# Patient Record
Sex: Male | Born: 1960 | Race: White | Hispanic: No | Marital: Married | Smoking: Never smoker
Health system: Southern US, Community
[De-identification: ages and names within clinical notes are randomized; demographics above are authoritative.]

## PROBLEM LIST (undated history)

## (undated) DIAGNOSIS — I10 Essential (primary) hypertension: Secondary | ICD-10-CM

## (undated) DIAGNOSIS — I2510 Atherosclerotic heart disease of native coronary artery without angina pectoris: Secondary | ICD-10-CM

## (undated) HISTORY — PX: CORONARY STENT PLACEMENT: SHX1402

---

## 2004-04-13 ENCOUNTER — Emergency Department (HOSPITAL_COMMUNITY): Admission: EM | Admit: 2004-04-13 | Discharge: 2004-04-13 | Payer: Self-pay | Admitting: *Deleted

## 2006-06-20 ENCOUNTER — Inpatient Hospital Stay (HOSPITAL_COMMUNITY): Admission: EM | Admit: 2006-06-20 | Discharge: 2006-06-21 | Payer: Self-pay | Admitting: Emergency Medicine

## 2006-06-20 IMAGING — CR DG CHEST 1V PORT
1 series · 1 of 1 positions shown · non-contrast
Comparison: None.

CLINICAL DATA: Chest and left arm pain

[view not recorded]
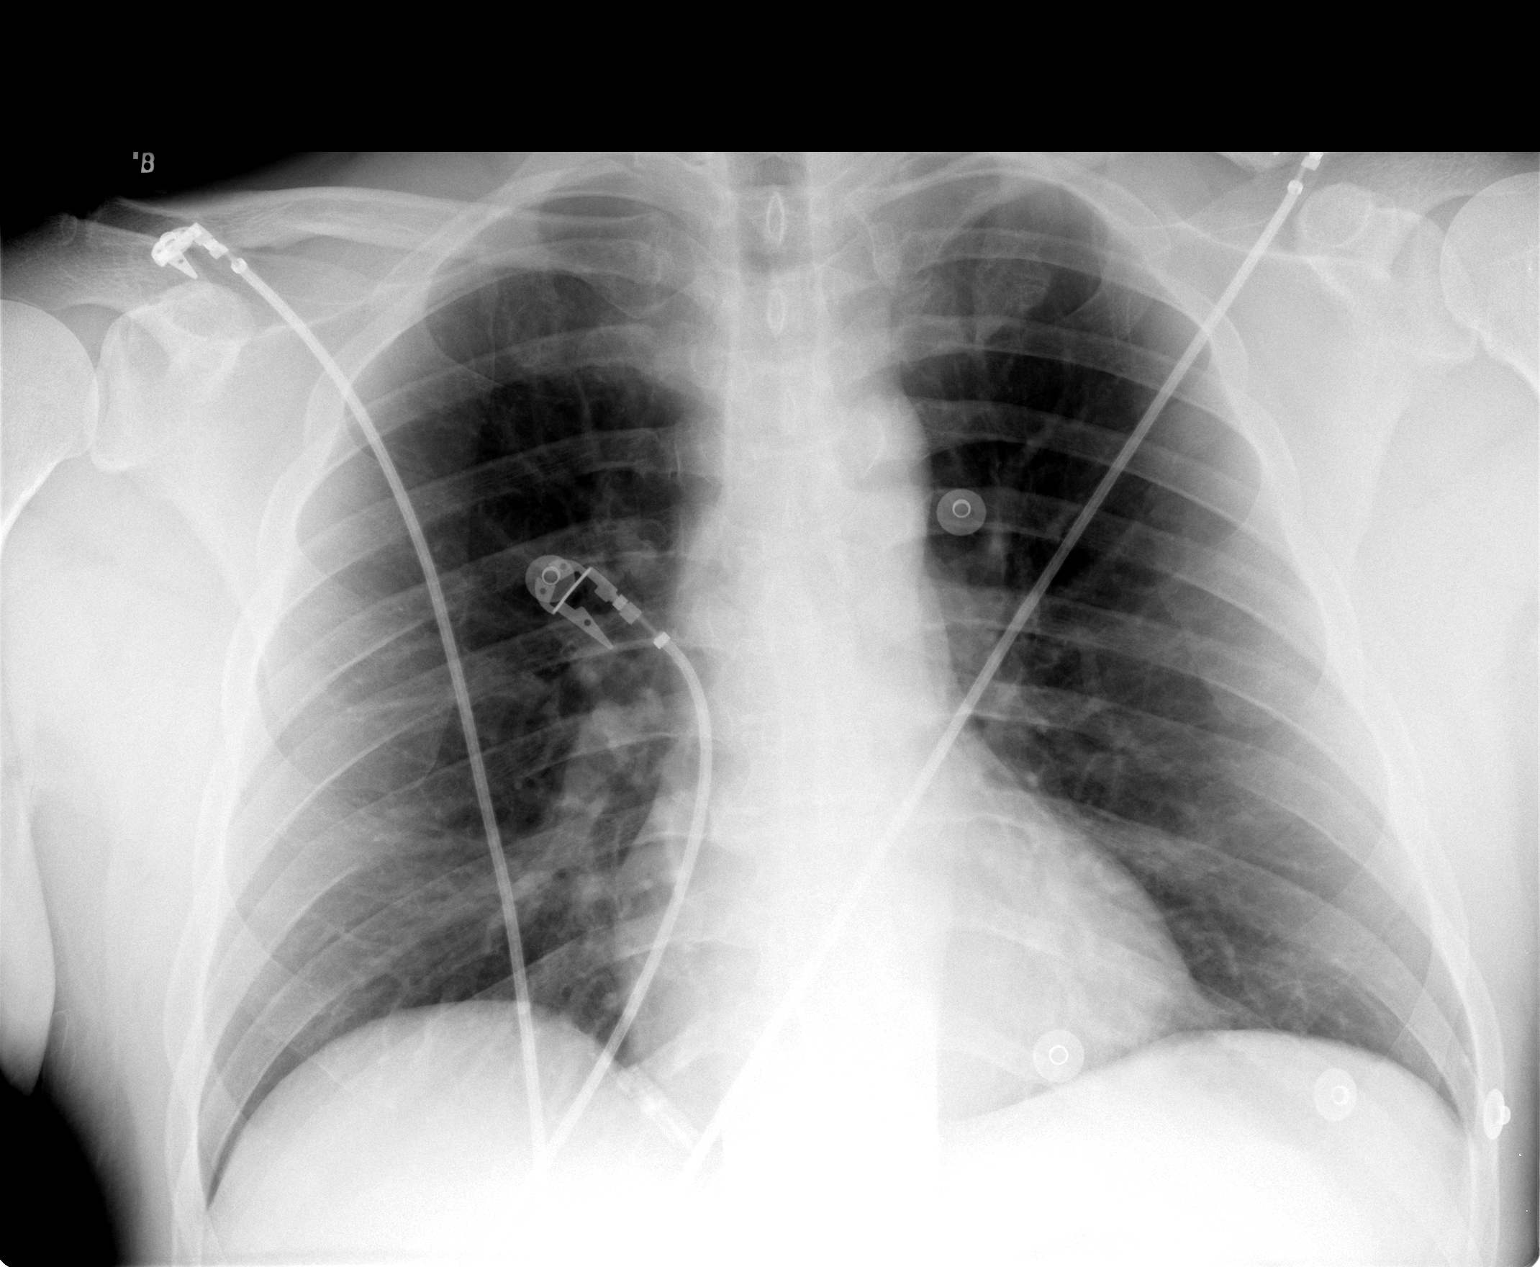

[1 of 1 positions shown; findings below may reference images not displayed]

PORTABLE CHEST - 1 VIEW:

0890 hours. The lungs are clear. Heart size is normal. Imaged bony structures of
the thorax are intact. Telemetry leads overlie the chest.
IMPRESSION: No acute cardiopulmonary findings.

## 2007-03-12 ENCOUNTER — Inpatient Hospital Stay (HOSPITAL_COMMUNITY): Admission: EM | Admit: 2007-03-12 | Discharge: 2007-03-15 | Payer: Self-pay | Admitting: Emergency Medicine

## 2007-03-12 IMAGING — CR DG CHEST 1V PORT
1 series · 1 of 1 positions shown · non-contrast
Comparison: 06/20/06
 The heart size and mediastinal contours are within normal limits.  Both lungs are clear.  The visualized skeletal structures are unremarkable.

CLINICAL DATA: Chest pain.  
 PORTABLE CHEST- 1 VIEW (1612 hours):

[view not recorded]
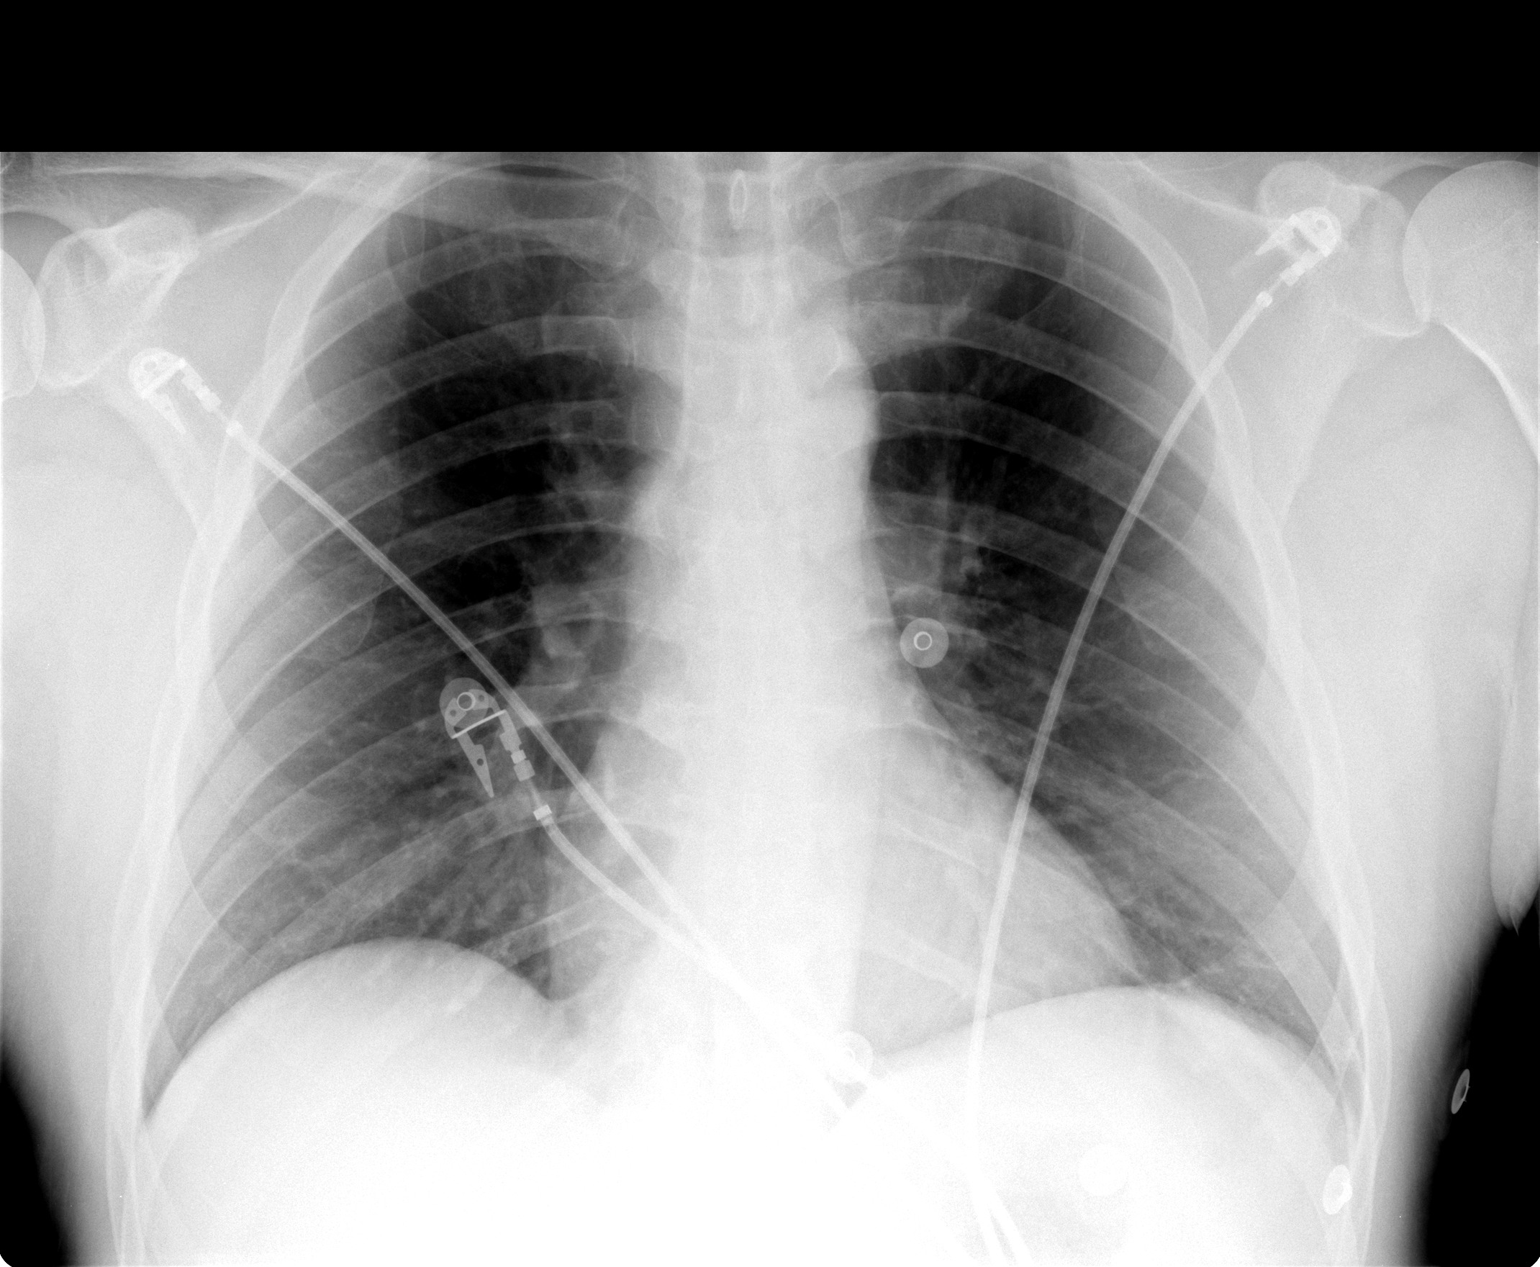

[1 of 1 positions shown; findings below may reference images not displayed]

IMPRESSION: No active cardiopulmonary disease.

## 2007-03-27 ENCOUNTER — Encounter (HOSPITAL_COMMUNITY): Admission: RE | Admit: 2007-03-27 | Discharge: 2007-05-06 | Payer: Self-pay | Admitting: Cardiology

## 2008-02-11 ENCOUNTER — Ambulatory Visit (HOSPITAL_COMMUNITY): Admission: RE | Admit: 2008-02-11 | Discharge: 2008-02-11 | Payer: Self-pay | Admitting: Cardiology

## 2010-12-19 ENCOUNTER — Encounter (HOSPITAL_BASED_OUTPATIENT_CLINIC_OR_DEPARTMENT_OTHER)
Admission: RE | Admit: 2010-12-19 | Discharge: 2010-12-19 | Disposition: A | Discharge status: Home / Self Care | Payer: BC Managed Care – PPO | Source: Ambulatory Visit | Attending: Orthopedic Surgery | Admitting: Orthopedic Surgery

## 2010-12-19 ENCOUNTER — Ambulatory Visit
Admission: RE | Admit: 2010-12-19 | Discharge: 2010-12-19 | Disposition: A | Discharge status: Home / Self Care | Payer: BC Managed Care – PPO | Source: Ambulatory Visit | Attending: Orthopedic Surgery | Admitting: Orthopedic Surgery

## 2010-12-19 ENCOUNTER — Other Ambulatory Visit: Payer: Self-pay | Admitting: Orthopedic Surgery

## 2010-12-19 DIAGNOSIS — Z01811 Encounter for preprocedural respiratory examination: Secondary | ICD-10-CM

## 2010-12-19 LAB — POCT I-STAT, CHEM 8
Calcium, Ion: 1.21 mmol/L (ref 1.12–1.32)
Glucose, Bld: 115 mg/dL — ABNORMAL HIGH (ref 70–99)
HCT: 44 % (ref 39.0–52.0)
Hemoglobin: 15 g/dL (ref 13.0–17.0)
TCO2: 26 mmol/L (ref 0–100)

## 2010-12-19 IMAGING — CR DG CHEST 2V
2 series · 2 of 2 positions shown · non-contrast
Comparison: Portable chest x-ray of 03/12/2007

CLINICAL DATA: Preop for ankle surgery

CHEST - 2 VIEW

[w chest pa]
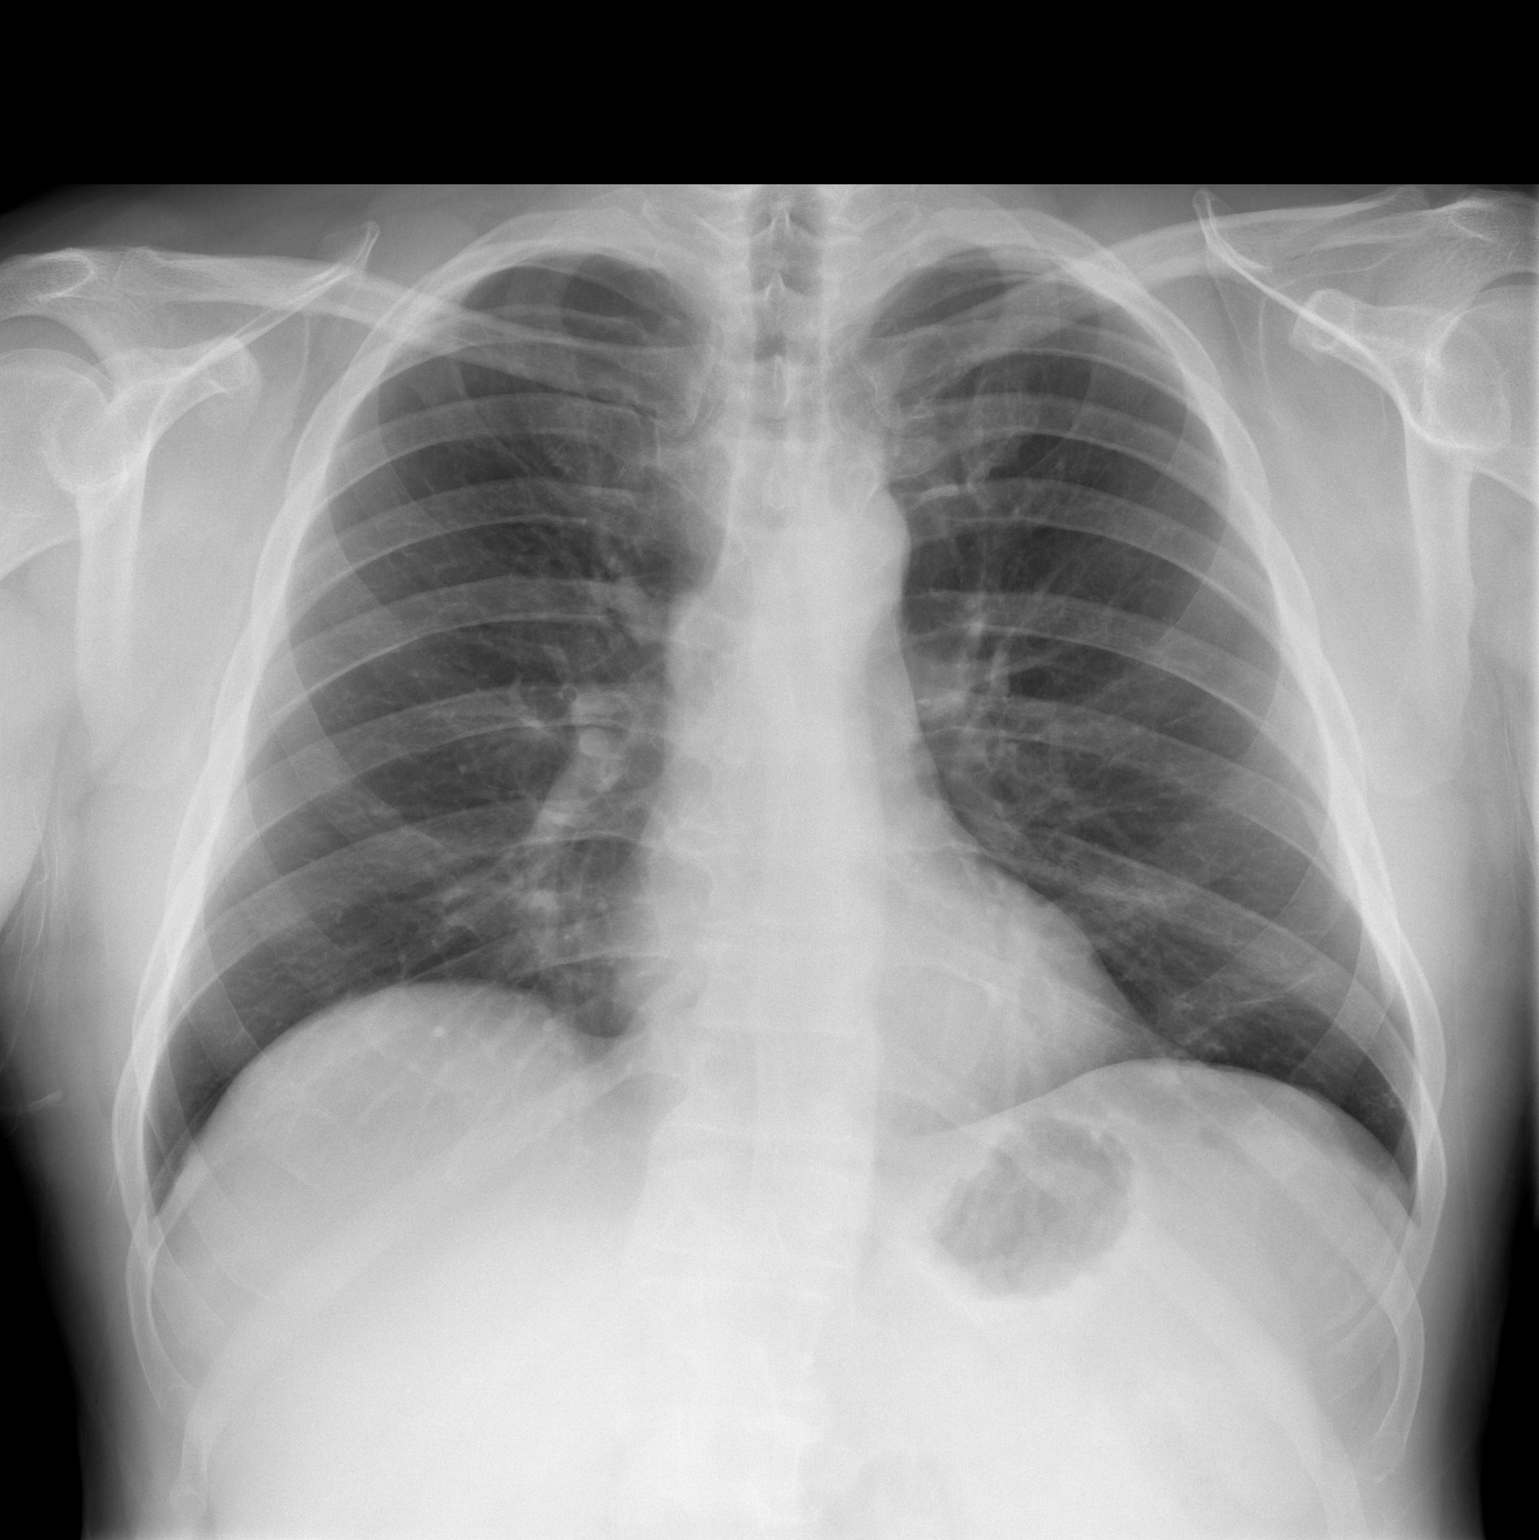

[w chest lat]
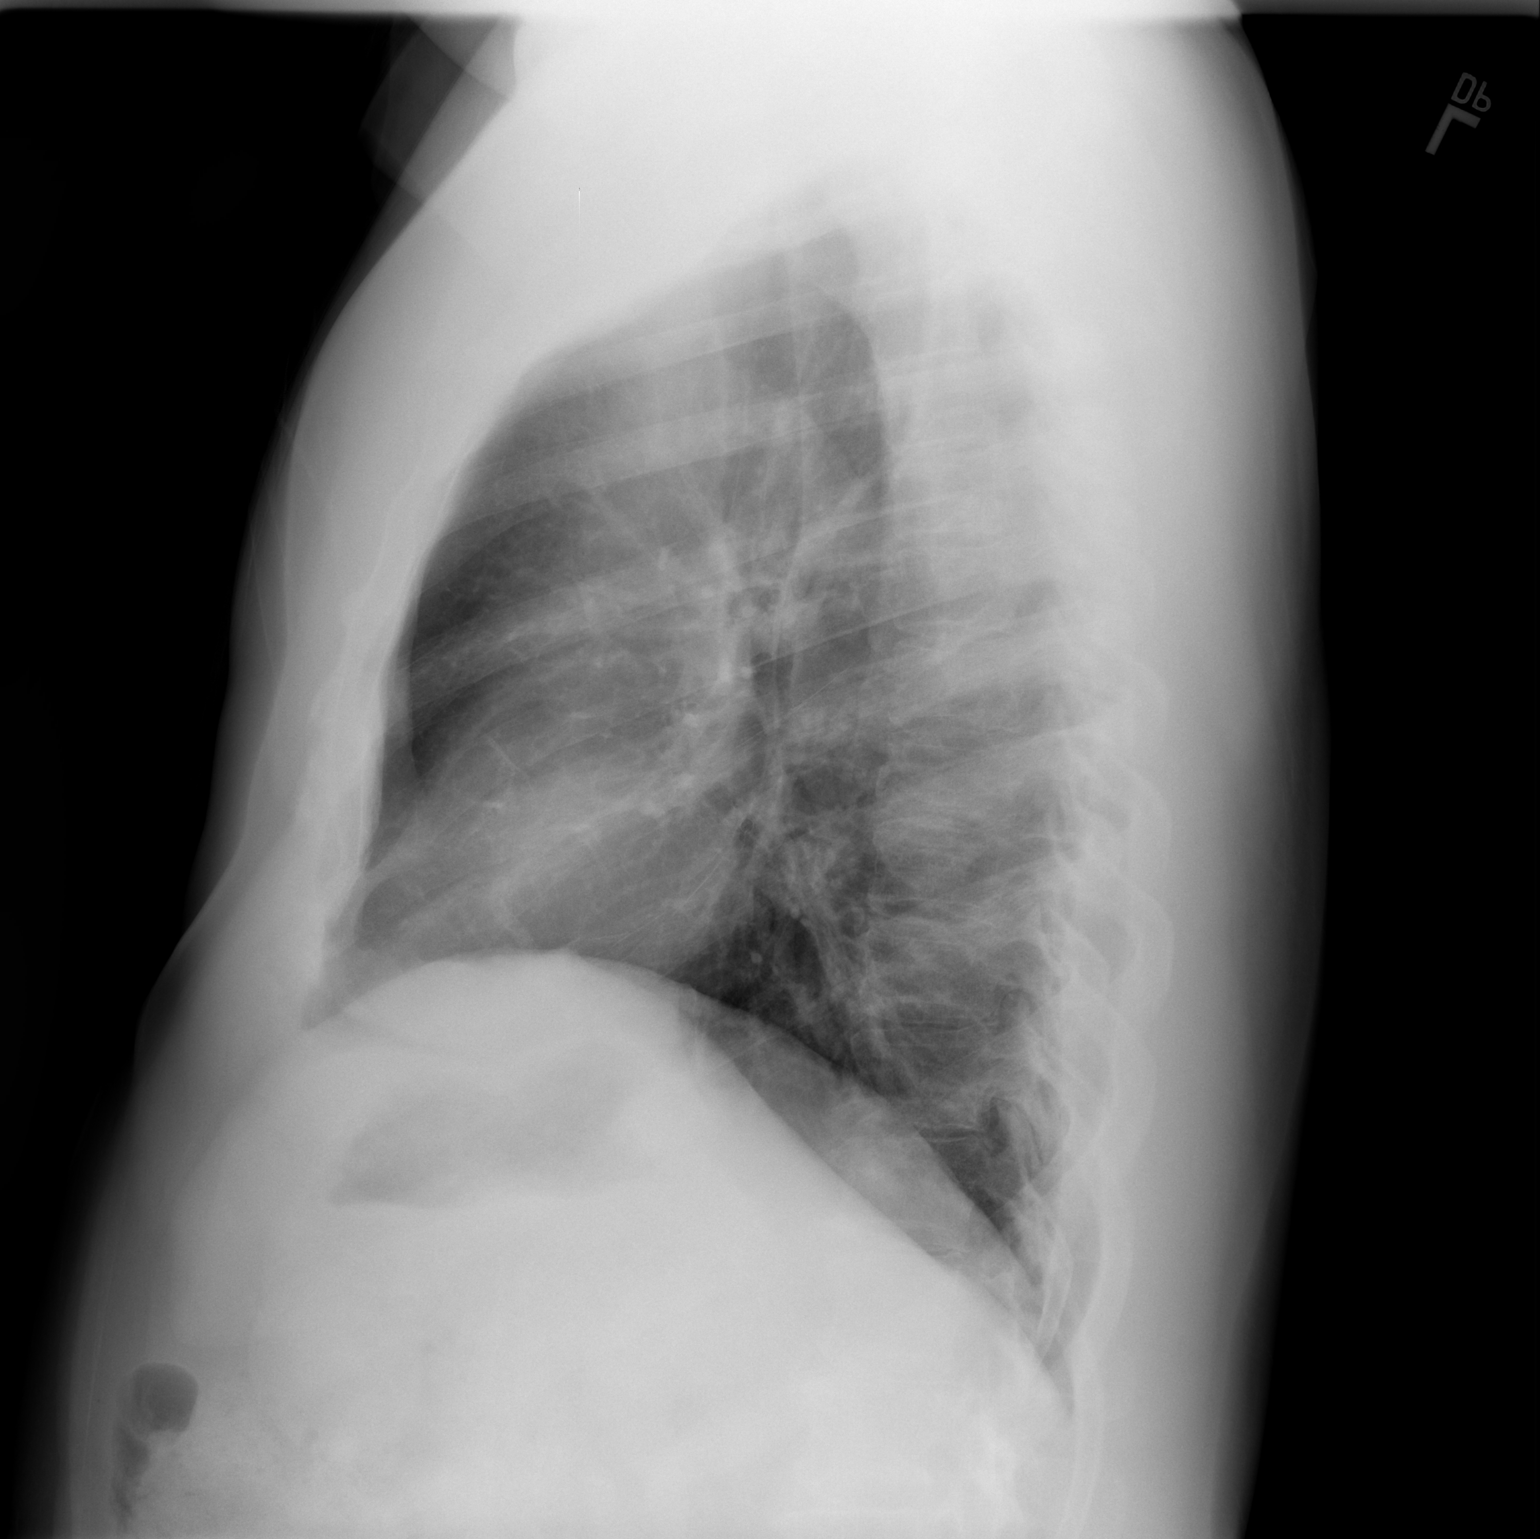

[2 of 2 positions shown; findings below may reference images not displayed]

FINDINGS: The lungs are clear.  Mediastinal contours are stable.
The heart is within normal limits in size.  No bony abnormality is
seen.
IMPRESSION: No active lung disease.

## 2010-12-19 NOTE — Cardiovascular Report (Signed)
NAME:  Mark Melendez, Mark Melendez NO.:  [id number]   MEDICAL RECORD NO.:  [id number]          PATIENT TYPE:  INP   LOCATION:  6524                         FACILITY:  [facility]   PHYSICIAN:  Mohan N. Sharyn Lull, M.D. DATE OF BIRTH:  Dec 31, 1960   DATE OF PROCEDURE:  03/14/2007  DATE OF DISCHARGE:                            CARDIAC CATHETERIZATION   PROCEDURE PERFORMED:  1. Left cardiac catheterization with selective left and right coronary      angiography, left ventriculography via the right groin using the      PIMS technique.  2. Successful percutaneous transluminal coronary angioplasty to the      proximal right coronary artery using a 2.5 x 8 mm long Voyager      balloon.  3. Successful deployment of a 3.0 x 18 mm long Cypher drug-eluting      stent in the proximal right coronary artery.  4. Successful post dilatation of the Cypher drug-eluting stent using a      3.25 x 13 mm long and then a 3.5 x 13 mm long Power Sail balloon.  5. Successful percutaneous transluminal coronary angioplasty to the      proximal left circumflex using initially 2.5 x 8 mm long Voyager      balloon, and then a 2.5 x 13 mm long Voyager balloon for pre-      dilatation.  6. Successful deployment of a 3.0 x 32 mm long Taxus drug-eluting      stent in the proximal and mid left circumflex.  7. Successful post dilatation of the Taxus drug-eluting stent using a      3.25 x 18 mm long Power Sail balloon in the mid and proximal left      circumflex.  8. Successful post dilatation of the proximal stent using a 3.5 x 13      mm long Power Sail balloon.   INDICATIONS FOR PROCEDURE:  Mark Melendez is a 50 year old white male with a  past medical history significant for hypertension, hypercholesterolemia,  hyperhomocysteinemia, glucose intolerance, positive family history of  coronary artery disease.  He came to the ER complaining of retrosternal  burning chest pain off-and-on since Monday, lasting a few  minutes,  associated with diaphoresis.  Grade initially 3/10, and then grade 8/10;  so he decided to come to the ER.  He denies any nausea or vomiting,  denies palpitations, lightheadedness, syncope, denies shortness of  breath, denies PND, orthopnea or leg swelling.  Denies exertional chest  pain.  Denies relation of chest pain to food, breathing or movement.   PAST MEDICAL HISTORY:  As above.   PAST SURGICAL HISTORY:  1. Right knee and ankle surgery in the past.  2. Had bilateral carpal tunnel surgery in 2006.   ALLERGIES:  No known drug allergies.   MEDICATIONS:  1. At home, was on enteric coated aspirin 81 mg p.o. daily.  2. Toprol 25 mg p.o. daily.  3. Lipitor 20 mg p.o. daily which he stopped.  4. Elita Quick one p.o. daily.  5. Nitrostat sublingual as needed.   SOCIAL HISTORY:  He is married  and has two children.  No history of  smoking.  Drinks beer and wine socially.  He works as an Interior and spatial designer.   FAMILY HISTORY:  Father died of a massive MI at the age of 72.  Mother  is alive, she has lymphoma.  One brother had a lacunar infarction in his  25s.  One uncle died of an MI in his 23s.  Two aunts had MI in their  72s.   PHYSICAL EXAMINATION:  GENERAL APPEARANCE:  On examination, he was  alert, awake and oriented x3, in no acute distress.  VITAL SIGNS:  Blood pressure was 157/92, pulse was 71.  EYES:  Conjunctivae are pink.  NECK:  Supple.  No JVD, no bruit.  LUNGS:  Lungs were clear to auscultation without rhonchi or rales.  CARDIOVASCULAR:  S1 and S2 were normal.  There was a soft systolic  murmur.  ABDOMEN:  The abdomen was soft.  Bowel sounds were present, nontender.  EXTREMITIES:  There was no clubbing, cyanosis or edema.   LABORATORY INVESTIGATIONS:  His two sets of cardiac enzymes, CPK, MB and  troponin I were negative.  Glucose was 118, his cholesterol was 225, LDL  was 143, HDL 29.  Hemoglobin A1c was 5.8, creatinine was 1.0.  Hemoglobin was 15.9,  hematocrit 42.3.   INTERVAL HOSPITAL COURSE:  The patient was admitted to the telemetry  unit and was ruled out by serial enzymes and EKG.  The patient  subsequently underwent a stress Myoview today, exercised for 9.52  minutes on the Bruce protocol with complaints of retrosternal chest  tightness associated with shortness of breath and dizziness, with drop  in blood pressure at the peak of exercise.  Peak heart rate was 138,  peak blood pressure was 172/86.  EKG showed normal sinus rhythm with  nonspecific T wave changes at baseline, with upsloping ST depression in  the inferolateral leads which reverted towards baseline.  Recurrent PVCs  in the apices were noted during the test.  The Myoview scan showed an  area of reversible ischemia centered in the lateral wall with extension  in the anterolateral and inferior wall at the base with an EF of 54%.  Due to the typical exertional chest pain and positive stress test and  multiple risk factors, discussed with the patient at length regarding  left catheterization and possible PTCA stenting, its risks and benefits,  i.e., death and mild stroke, need for emergency CABG, risks of  restenosis, local vascular complications, etc.; and he consented for the  procedure.   DESCRIPTION OF PROCEDURE:  After obtaining informed consent, the patient  was brought to the catheterization lab and was placed on the fluoroscopy  table.  The right groin was prepped and draped in the usual fashion.  Then 2% Xylocaine was used for local anesthesia in the right groin.  With the help of a thin-walled needle, 6-French arterial sheath was  placed.  The sheath was aspirated and flushed.  Next a 6-French left  straight __________ catheter was advanced over the wire under  fluoroscopic guidance to the ascending aorta.  That was pulled out and  the catheter was aspirated and connected to the manifold.  The catheter  was further advanced and engaged into the left coronary  ostium.  Multiple views of the left system were taken.  Next the catheter was  disengaged and was pulled out over the wire and was replaced with a 6-  French right Judkins catheter which was advanced  over the wire under  fluoroscopic guidance to the ascending area.  That was pulled out and  the catheter was aspirated and connected to the manifold.  The catheter  was further advanced and engaged into the right coronary ostium.  Multiple views of the right system were taken.   Next the catheter was disengaged and was pulled out over the wire and  was replaced with a 6-French pigtail catheter which was advanced over  the wire under fluoroscopic guidance to the ascending aorta.  The  catheter was further advanced across the aortic valve into the LV.  LV  pressures were record.  Next left ventriculography was done in 30-degree  RAO projection.  Next, angiographic pressures were recorded from the LV  and then pullback pressures were recorded from the aorta.  There was no  significant gradient across the aortic valve.  Next the pigtail catheter  was pulled out over the wire, the sheaths were aspirated and flushed.   FINDINGS:  The LV showed good LV systolic function with an EF of 50-55%.  The left main was patent.  The LAD had a 20-30% mid sequential stenosis.  The distal LAD vessel was small, appears to be diffusely diseased.  The  diagonal 1 and 2 were very, very small.  The diagonal 3 was small which  was patent.  The ramus was very, very small, less than 1 mm.  The left  circumflex had 95-99% proximal stenosis, and 50-60% mid bifurcation  stenosis with __________ OM-3.  The OM-1 and -2 were less than 0.5 mm.  The OM-3 was moderate sized which has 50-60% ostial stenosis.  The RCA  has 80-85% proximal stenosis, and 20-25% mid and distal stenoses.  The  PDA was very small which was patent.  The LV branch is very patent.   INTERVENTIONAL PROCEDURE:  Successful PTCA to proximal RCA was done   using a 2.5 x 8 mm long Voyager balloon for pre-dilatation and then a  3.8 x 18 mm long Cypher drug-eluting stent was deployed at 15  atmospheres pressure and the proximal RCA stent was post dilated using  initially a 3.25 x 13 mm long Power Sail balloon, and then a 3.5 x 13 mm  long Power Sail balloon, going up to 19 atmospheres pressure.  The  lesion was dilated from 80-85% to 0% residual with excellent TIMI-3  distal flow, without evidence of dissection or distal embolization.   Next successful PTCA to the proximal left circumflex was done using a  2.5 x 8 mm long Voyager balloon for pre-dilatation, and then a 2.5 x 13  mm long Voyager balloon for pre-dilatation, and then attempted to deploy  a 3.0 x 18 mm long Cypher drug-eluting stent in the proximal left  circumflex without success, as the stent would not track down over the  wire.  Angiogram showed focal dissection at the distal edge of the  lesion with localized __________, and then the PTCA to the proximal and  mid junction of the left circumflex was done using a 3.0 x 13 mm long  Voyager balloon, and then a 3.0 x 32 mm long Cypher drug-eluting stent  was deployed in the mid and proximal left circumflex at 15 atmospheres  of pressure.  The stent was first dilated using a 3.25 x 18 mm long  Power Sail balloon going up to 19 atmospheres of pressure.  Proximal  pressure of the stent was further dilated using a 3.5 x 13 mm long Power  Sail balloon, going up to 18 atmospheres pressure.  The lesion was  dilated from 95-99% to less than 10% residual with excellent TIMI-3  distal flow without evidence of final dissection or distal embolization.  The patient received weight-based heparin, ampicillin, and 600 mg of  Plavix during the procedure.  The patient tolerated the procedure well.  There were no complications.  The patient was transferred to the  recovery room in stable condition.      Eduardo Osier. Sharyn Lull, M.D.  Electronically  Signed     MNH/MEDQ  D:  03/14/2007  T:  03/15/2007  Job:  [id number]   cc:   Catherization Laboratory

## 2010-12-22 ENCOUNTER — Other Ambulatory Visit: Payer: Self-pay | Admitting: Orthopedic Surgery

## 2010-12-22 ENCOUNTER — Ambulatory Visit (HOSPITAL_BASED_OUTPATIENT_CLINIC_OR_DEPARTMENT_OTHER)
Admission: RE | Admit: 2010-12-22 | Discharge: 2010-12-22 | Disposition: A | Discharge status: Home / Self Care | Payer: BC Managed Care – PPO | Source: Ambulatory Visit | Attending: Orthopedic Surgery | Admitting: Orthopedic Surgery

## 2010-12-22 DIAGNOSIS — G5750 Tarsal tunnel syndrome, unspecified lower limb: Secondary | ICD-10-CM | POA: Insufficient documentation

## 2010-12-22 DIAGNOSIS — I10 Essential (primary) hypertension: Secondary | ICD-10-CM | POA: Insufficient documentation

## 2010-12-22 DIAGNOSIS — Z01812 Encounter for preprocedural laboratory examination: Secondary | ICD-10-CM | POA: Insufficient documentation

## 2010-12-22 DIAGNOSIS — I2510 Atherosclerotic heart disease of native coronary artery without angina pectoris: Secondary | ICD-10-CM | POA: Insufficient documentation

## 2010-12-22 DIAGNOSIS — K219 Gastro-esophageal reflux disease without esophagitis: Secondary | ICD-10-CM | POA: Insufficient documentation

## 2010-12-22 DIAGNOSIS — J13 Pneumonia due to Streptococcus pneumoniae: Secondary | ICD-10-CM | POA: Insufficient documentation

## 2010-12-22 DIAGNOSIS — M6740 Ganglion, unspecified site: Secondary | ICD-10-CM | POA: Insufficient documentation

## 2010-12-22 LAB — POCT HEMOGLOBIN-HEMACUE: Hemoglobin: 14.7 g/dL (ref 13.0–17.0)

## 2010-12-22 NOTE — Discharge Summary (Signed)
NAME:  Mark Melendez, Mark Melendez NO.:  [id number]   MEDICAL RECORD NO.:  [id number]          PATIENT TYPE:  INP   LOCATION:  6524                         FACILITY:  [facility]   PHYSICIAN:  Ricki Rodriguez, M.D.  DATE OF BIRTH:  September 17, 1960   DATE OF ADMISSION:  03/12/2007  DATE OF DISCHARGE:  03/15/2007                               DISCHARGE SUMMARY   DISCHARGE DIAGNOSES:  1. Coronary atherosclerosis of native coronary vessel.  2. Hypertension.  3. Hypercholesterolemia.   PROCEDURE:  Left heart catheterization, selective coronary angiography  and stent placement in the proximal right coronary artery and left  circumflex coronary artery by Dr. Rinaldo Cloud on March 14, 2007.   DISCHARGE MEDICATIONS:  1. Zocor/simvastatin 40 mg one in the evening.  2. Metoprolol 25 mg half twice daily.  3. Multivitamin with B complex one daily.  4. Aspirin 325 mg one daily in the evening.  5. Plavix 75 mg one daily.  6. Lisinopril 5 mg daily.  7. Niacin 500 mg in the evening.  8. Nitroglycerin 0.4 mg tablet one sublingual every 5 minutes x3 as      needed for chest pain.   DIET:  Low-sodium, heart-healthy diet.   WOUND CARE:  The patient will notify MD if he has right groin pain or  swelling of his joints.   ACTIVITY:  The patient is to increase activity slowly and stop any  activity that causes chest pain, shortness of breath, dizziness,  sweating or excessive weakness.   FOLLOW UP:  Follow up by Dr. Rinaldo Cloud in 2 weeks.  The patient or  family to call [phone].   CONDITION ON DISCHARGE:  Improved.   HISTORY OF PRESENT ILLNESS:  This is a 50 year old, white male who  presented with retrosternal burning chest pain off and on x 2 days.   PHYSICAL EXAMINATION:  GENERAL:  The patient was alert and oriented x3  in no acute distress.  VITAL SIGNS:  Blood pressures 157/92, pulse 71.  HEENT:  Conjunctivae pink.  NECK:  Supple.  No JVD, no carotid bruit.  LUNGS:  Clear to  auscultation.  CARDIOVASCULAR:  Normal S1, S2.  ABDOMEN:  Soft.  EXTREMITIES:  Without edema, cyanosis or clubbing.   LABORATORY DATA:  Normal hemoglobin/hematocrit, WBC and platelet count.  Normal electrolytes with glucose borderline at 118.  Liver enzymes  normal.  Cardiac enzymes normal x3.  Cholesterol elevated at 225,  triglyceride elevated to 63, HDL cholesterol low at 29, LDL cholesterol  of 143.   Nuclear medicine showed reversible ischemia in lateral wall and some  extension into anterolateral and anterior wall with a normal left  ventricular systolic function and ejection fraction of 53%.   EKG revealed normal sinus rhythm.   Cardiac catheterization revealed significant proximal right coronary  artery lesion and proximal left circumflex coronary artery lesion.   HOSPITAL COURSE:  The patient was admitted to telemetry unit and  myocardial infarction was ruled out.  He underwent nuclear stress test  that showed significant anterolateral ischemia.  The patient underwent  cardiac catheterization that showed a significant  proximal right  coronary artery and proximal left circumflex coronary artery lesion.  The right coronary artery lesion was dilated using 3 x 18-mm, Cypher  stent.  The 90% lesion was reduced to 0% with TIMI-3 flow and proximal  left circumflex lesion was dilated using 3 x 18-mm long, Cypher stent  again with good result and TIMI-3 flow.   The patient's post procedure stay was unremarkable.  He was referred for  cardiac rehabilitation on March 15, 2007.  On March 15, 2007, he was  discharged home in satisfactory condition with followup by Dr. Rinaldo Cloud in 2 weeks.      Ricki Rodriguez, M.D.  Electronically Signed     ASK/MEDQ  D:  06/12/2007  T:  06/13/2007  Job:  [id number]

## 2010-12-22 NOTE — Discharge Summary (Signed)
NAME:  SIMSDinesh, Ulysse NO.:  [id number]   MEDICAL RECORD NO.:  [id number]          PATIENT TYPE:  INP   LOCATION:  2039                         FACILITY:  [facility]   PHYSICIAN:  Eduardo Osier. Sharyn Lull, M.D. DATE OF BIRTH:  10-06-1960   DATE OF ADMISSION:  06/20/2006  DATE OF DISCHARGE:  06/21/2006                                 DISCHARGE SUMMARY   ADMITTING DIAGNOSES:  1. Chest pain, rule out myocardial infarction.  2. Elevated blood sugar, rule out diabetes mellitus.  3. Elevated blood pressure, rule out hypertension.  4. Positive family history of coronary artery disease.   DISCHARGE DIAGNOSES:  1. Status post chest pain, myocardial infarction ruled out, negative      stress Myoview.  2. New onset hypertension.  3. Glucose intolerance.  4. Hypercholesteremia.  5. Hyperhomocysteinemia.  6. Positive family history of coronary artery disease.   DISCHARGE MEDICATIONS:  1. Enteric-coated aspirin 81 mg 1 tablet daily.  2. Toprol XL 25 mg 1 tablet daily.  3. Lipitor 20 mg 1 tablet daily.  4. Foltx 1 tablet daily.  5. Nitrostat 0.4 mg sublingual, use as directed.   DIET:  Low-salt, low-cholesterol, 1800 calories ADA diet.  The patient has  been advised to avoid sweets.   FOLLOW UP:  Follow up with me in 1 week.   CONDITION AT DISCHARGE:  Stable.   BRIEF HISTORY AND HOSPITAL COURSE:  Mr. Mentink is a 50 year old white male  with no significant past medical history except for strong family history of  coronary artery disease.  He came to the ER complaining of both arm pain  radiating to anterior chest, described as tightness and heaviness, grade  3/10, associated with mild shortness of breath.  The patient received IV  nitroglycerin in the ER with relief of chest tightness.  The patient denies  any nausea, vomiting, diaphoresis.  Denies palpitation.  Denies exertional  chest pain, although his activity is limited. States gets short of breath  with minimal exertion.   States that similar chest pain approximately 20+  years ago and had stress test which was negative. The patient presently  denies any chest pain, shortness of breath.  Denies cough, fever or chills.  Denies relational chest pain to food, breathing or movement.   PAST MEDICAL HISTORY:  As above.   PAST SURGICAL HISTORY:  He had right knee surgery in the past and right  ankle surgery, and bilateral carpal tunnel surgery in the past.   ALLERGIES:  No known drug allergies.   MEDICATIONS:  None.   SOCIAL HISTORY:  He is married, has two children.  No history of smoking.  Drinks beer and wine socially.  Works as Retail banker.   FAMILY HISTORY:  Father died of massive MI at age of 28.  Mother is alive.  She has lymphoma. She is 41 years of age.  One uncle died of MI in his 8s.  Two aunts had MI in their 28s.  One brother had lacunar infarct at the age  of 50.  Two sisters in good health.   PHYSICAL EXAMINATION:  GENERAL:  He is alert, awake, oriented x3, in no  acute distress.  VITAL SIGNS: Blood pressure was 145/89, pulse was 77. There were frequent  PVCs and PVCs on the monitor.  HEENT:  Conjunctivae pink.  NECK: Supple, no JVD, no bruit.  LUNGS: Were clear to auscultation without rhonchi or rales.  CARDIOVASCULAR:  S1 and S2 were normal.  There was no S3 gallop.  There was  no murmur.  ABDOMEN: Soft.  Bowel sounds were present, nontender.  EXTREMITIES: There is no clubbing, cyanosis or edema.   LABORATORY DATA:  EKG showed normal sinus rhythm with occasional PVCs.  Repeat EKG showed normal sinus rhythm with no acute ischemic changes.   Two sets of point-of-care cardiac enzymes: CK-MB was 1.7 and 1.2.  Troponin  I three sets were 0.05, 0.05 and 0.05.  His C-reactive protein was low; it  was point 0.1.  His cholesterol was 231, triglycerides 342, LDL 112, HDL 41.  Three sets of cardiac enzymes by lab: CK 153, MB 1.8; second set, CK 99, MB  1.5; third set, CK 91, MB 1.4.  Troponin I three sets were 0.01, 0.02, 0.01.  Homocystine level was elevated at 17.3. Hemoglobin A1c was 6.0.  Sodium was  140, potassium 4.5, chloride 107, bicarb 25, glucose 79, BUN 14, creatinine  0.9.  Repeat sugar was 116.  Hemoglobin was 15.6, hematocrit 42.4, white  count of 7.0.   BRIEF HOSPITAL COURSE:  The patient was admitted to telemetry unit.  MI was  ruled out by serial enzymes and EKG.  The patient did not have any episodes  of chest pain during the hospital stay.   The patient subsequently underwent stress Myoview and exercised for 10  minutes on Bruce protocol with complaints of fatigue and shortness of  breath.  EKG showed nonspecific T-wave changes.  No acute ischemic changes  were noted.  There were frequent APCs, PVCs and occasional ventricular  couplets during the exercise. Myoview scan showed no evidence of reversible  ischemia with EF of 60%. The patient his pain free since hospital admission.  He is ambulating in hallway without any problems, will be discharged home on  above medications and will be followed up in my office in 1 week.           ______________________________  Eduardo Osier. Sharyn Lull, M.D.     MNH/MEDQ  D:  06/21/2006  T:  06/21/2006  Job:  [id number]

## 2011-01-08 NOTE — Op Note (Signed)
NAME:  Mark Melendez, Mark Melendez                ACCOUNT NO.:  [id number]  MEDICAL RECORD NO.:  [id number]           PATIENT TYPE:  LOCATION:                                 FACILITY:  PHYSICIAN:  Toni Arthurs, MD             DATE OF BIRTH:  DATE OF PROCEDURE:  12/22/2010 DATE OF DISCHARGE:                              OPERATIVE REPORT   PREOPERATIVE DIAGNOSIS:  Left tarsal tunnel ganglion cyst.  POSTOPERATIVE DIAGNOSIS:  Left tarsal tunnel ganglion cyst.  PROCEDURE: 1. Left tarsal tunnel release. 2. Left tibial nerve neurolysis. 3. Excision of ganglion cyst from the left tarsal tunnel.  SURGEON:  Toni Arthurs, MD.  ANESTHESIA:  General.  IV FLUIDS:  See anesthesia record.  ESTIMATED BLOOD LOSS:  Minimal.  TOURNIQUET TIME:  51 minutes at 250 mmHg.  COMPLICATIONS:  None apparent.  DISPOSITION:  Extubated, awake, and stable to recovery.  INDICATIONS FOR PROCEDURE:  The patient is a 50 year old male without significant past medical history, who complains of increasing pain in his left medial hind foot for the last several months.  He has significant medial and lateral plantar nerve dysesthesias.  An MRI of his hindfoot revealed a large ganglion cyst within the tarsal tunnel compressing the tibial nerve.  He presents now for excision of this tip of this cyst as well as neurolysis of his tibial nerve.  He understands the risks and benefits of this procedure as well as alternative treatment options.  He specifically understands risks of bleeding, infection, nerve damage, blood clots, need for additional surgery, amputation, and death.  He would like to proceed.  PROCEDURE IN DETAIL:  After preoperative consent was obtained, the correct operative site was identified, the patient was brought to the operating room and placed supine on the operating table.  General anesthesia was induced.  Preoperative antibiotics were administered. Surgical time-out was taken.  The left lower extremity  was prepped and draped in standard sterile fashion with tourniquet around the thigh.  A longitudinal incision was marked over the course of the tibial nerve directly over the palpable mass at the medial aspect of his hindfoot. The extremity was exsanguinated and tourniquet was inflated to 250 mmHg. Previously marked incision was made and sharp dissection was carried down through the skin and subcutaneous tissues.  The superficial retinaculum over the tarsal tunnel was incised.  It was divided proximally and distally releasing the tarsal tunnel completely.  The tibial nerve was identified and was noted to be stretched tightly over the superficial aspect of the mass within the tarsal tunnel.  The neurolysis was performed along the course of tibial nerve proximally and distally, freeing it up from the underlying mass and tissue proximally and distally.  A vessel loop was placed around the nerve and it was retracted gently anteriorly.  The mass was then exposed.  It was dissected circumferentially free from all adhesions within the tunnel itself.  The stalk was identified as coming off the flexor hallucis longus tendon sheath.  This was transected.  The mass was passed off the field as a specimen to pathology.  The tourniquet was released at 55 minutes.  Hemostasis was achieved.  The tibial nerve appeared generally healthy with no hour-glass appearance.  The blood supply to the nerve also appeared to be intact as it took on an appropriate pink appearance after release of tourniquet.  The FHL tendon was identified and appeared to be healthy.  There was pulsatile flow within the posterior tibial artery after release of the tourniquet.  The wound was irrigated copiously.  Inverted simple sutures of 3-0 Monocryl were used to approximate subcutaneous tissues.  A 3-0 Prolene suture was used to place horizontal mattress sutures to close the skin.  Sterile dressings were applied followed by a  compressive wrap and a well-padded short-leg splint.  The patient was then awakened from anesthesia and transported to recovery room in stable condition.  FOLLOWUP PLAN:  The patient will be nonweightbearing on his left lower extremity for the next 2 weeks and then follow-up with me for suture removal and conversion to full weightbearing.     Toni Arthurs, MD     JH/MEDQ  D:  12/22/2010  T:  12/22/2010  Job:  [id number]  Electronically Signed by Toni Arthurs  on 01/08/2011 08:45:50 AM

## 2011-01-09 ENCOUNTER — Emergency Department (HOSPITAL_COMMUNITY)
Admission: EM | Admit: 2011-01-09 | Discharge: 2011-01-09 | Disposition: A | Discharge status: Home / Self Care | Payer: BC Managed Care – PPO | Attending: Emergency Medicine | Admitting: Emergency Medicine

## 2011-01-09 DIAGNOSIS — E7800 Pure hypercholesterolemia, unspecified: Secondary | ICD-10-CM | POA: Insufficient documentation

## 2011-01-09 DIAGNOSIS — R040 Epistaxis: Secondary | ICD-10-CM | POA: Insufficient documentation

## 2011-01-09 DIAGNOSIS — I10 Essential (primary) hypertension: Secondary | ICD-10-CM | POA: Insufficient documentation

## 2011-01-09 DIAGNOSIS — Z79899 Other long term (current) drug therapy: Secondary | ICD-10-CM | POA: Insufficient documentation

## 2011-05-03 LAB — HEPATIC FUNCTION PANEL
ALT: 35
Bilirubin, Direct: <0.1
Total Protein: 6.9

## 2011-05-03 LAB — BASIC METABOLIC PANEL
BUN: 13
Calcium: 9.8
Creatinine, Ser: 1.01
GFR calc non Af Amer: >60
Glucose, Bld: 96
Potassium: 4.3

## 2011-05-03 LAB — LIPID PANEL
Cholesterol: 214 — ABNORMAL HIGH
Triglycerides: 333 — ABNORMAL HIGH

## 2011-05-21 LAB — COMPREHENSIVE METABOLIC PANEL
ALT: 26
AST: 30
CO2: 20
Chloride: 105
Creatinine, Ser: 0.93
GFR calc Af Amer: >60
GFR calc non Af Amer: >60
Glucose, Bld: 118 — ABNORMAL HIGH
Sodium: 136
Total Bilirubin: 1.1

## 2011-05-21 LAB — I-STAT 8, (EC8 V) (CONVERTED LAB)
BUN: 20
Bicarbonate: 24.4 — ABNORMAL HIGH
Chloride: 108
Glucose, Bld: 130 — ABNORMAL HIGH
HCT: 47
Hemoglobin: 16
Operator id: 272551
Potassium: 4.1
Sodium: 139
TCO2: 25
pCO2, Ven: 38 — ABNORMAL LOW
pH, Ven: 7.415 — ABNORMAL HIGH

## 2011-05-21 LAB — DIFFERENTIAL
Eosinophils Absolute: 0.1
Eosinophils Relative: 1
Lymphs Abs: 2.4
Monocytes Relative: 8

## 2011-05-21 LAB — CBC
HCT: 42.3
MCV: 84.9
Platelets: 243
RDW: 13.2

## 2011-05-21 LAB — LIPID PANEL
Cholesterol: 225 — ABNORMAL HIGH
LDL Cholesterol: 143 — ABNORMAL HIGH
Triglycerides: 263 — ABNORMAL HIGH

## 2011-05-21 LAB — COMPREHENSIVE METABOLIC PANEL WITH GFR
Albumin: 3.7
Alkaline Phosphatase: 55
BUN: 19
Calcium: 9.1
Potassium: 4.2
Total Protein: 6.5

## 2011-05-21 LAB — POCT CARDIAC MARKERS
CKMB, poc: <1 — ABNORMAL LOW
CKMB, poc: <1 — ABNORMAL LOW
Myoglobin, poc: 43.2
Myoglobin, poc: 50.9
Operator id: 272551
Operator id: 277751
Troponin i, poc: <0.05
Troponin i, poc: <0.05

## 2011-05-21 LAB — CARDIAC PANEL(CRET KIN+CKTOT+MB+TROPI)
CK, MB: 1.3
CK, MB: 1.6
Relative Index: 1.5
Total CK: 113

## 2011-05-21 LAB — APTT: aPTT: 27

## 2011-05-21 LAB — POCT I-STAT CREATININE
Creatinine, Ser: 1
Operator id: 272551

## 2011-05-21 LAB — TROPONIN I: Troponin I: 0.05

## 2011-05-21 LAB — LIPASE, BLOOD: Lipase: 18

## 2012-09-29 ENCOUNTER — Emergency Department (HOSPITAL_COMMUNITY)
Admission: EM | Admit: 2012-09-29 | Discharge: 2012-09-29 | Disposition: A | Discharge status: Home / Self Care | Payer: Worker's Compensation | Attending: Emergency Medicine | Admitting: Emergency Medicine

## 2012-09-29 ENCOUNTER — Emergency Department (HOSPITAL_COMMUNITY): Payer: Worker's Compensation

## 2012-09-29 ENCOUNTER — Encounter (HOSPITAL_COMMUNITY): Payer: Self-pay | Admitting: Emergency Medicine

## 2012-09-29 DIAGNOSIS — Z7902 Long term (current) use of antithrombotics/antiplatelets: Secondary | ICD-10-CM | POA: Insufficient documentation

## 2012-09-29 DIAGNOSIS — S61209A Unspecified open wound of unspecified finger without damage to nail, initial encounter: Secondary | ICD-10-CM | POA: Insufficient documentation

## 2012-09-29 DIAGNOSIS — I2510 Atherosclerotic heart disease of native coronary artery without angina pectoris: Secondary | ICD-10-CM | POA: Insufficient documentation

## 2012-09-29 DIAGNOSIS — Y990 Civilian activity done for income or pay: Secondary | ICD-10-CM | POA: Insufficient documentation

## 2012-09-29 DIAGNOSIS — I10 Essential (primary) hypertension: Secondary | ICD-10-CM | POA: Insufficient documentation

## 2012-09-29 DIAGNOSIS — Z79899 Other long term (current) drug therapy: Secondary | ICD-10-CM | POA: Insufficient documentation

## 2012-09-29 DIAGNOSIS — Z7982 Long term (current) use of aspirin: Secondary | ICD-10-CM | POA: Insufficient documentation

## 2012-09-29 DIAGNOSIS — Y9289 Other specified places as the place of occurrence of the external cause: Secondary | ICD-10-CM | POA: Insufficient documentation

## 2012-09-29 DIAGNOSIS — Y9389 Activity, other specified: Secondary | ICD-10-CM | POA: Insufficient documentation

## 2012-09-29 DIAGNOSIS — W2209XA Striking against other stationary object, initial encounter: Secondary | ICD-10-CM | POA: Insufficient documentation

## 2012-09-29 HISTORY — DX: Atherosclerotic heart disease of native coronary artery without angina pectoris: I25.10

## 2012-09-29 HISTORY — DX: Essential (primary) hypertension: I10

## 2012-09-29 IMAGING — CR DG FINGER MIDDLE 2+V*L*
3 series · 3 of 3 positions shown · non-contrast
Comparison: None.

CLINICAL DATA: Crushing injury left middle finger.

LEFT MIDDLE FINGER 2+V

[x finger pa left]
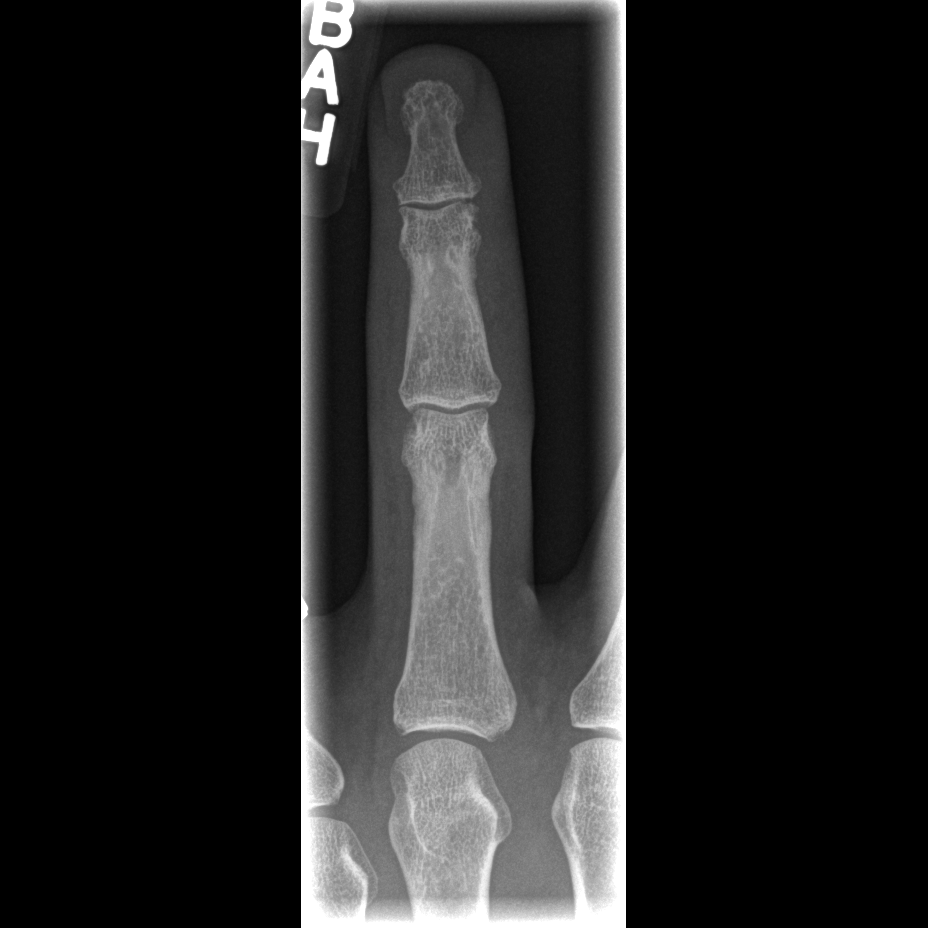

[x finger obl. left]
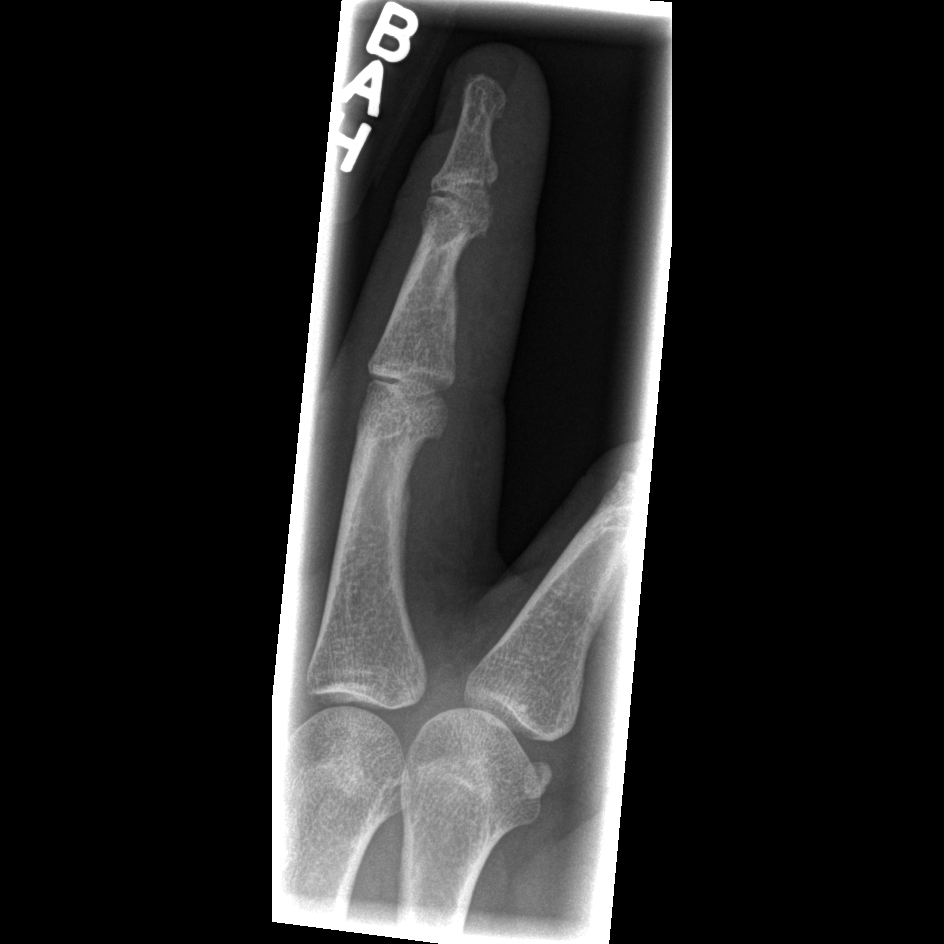

[x finger lateral left]
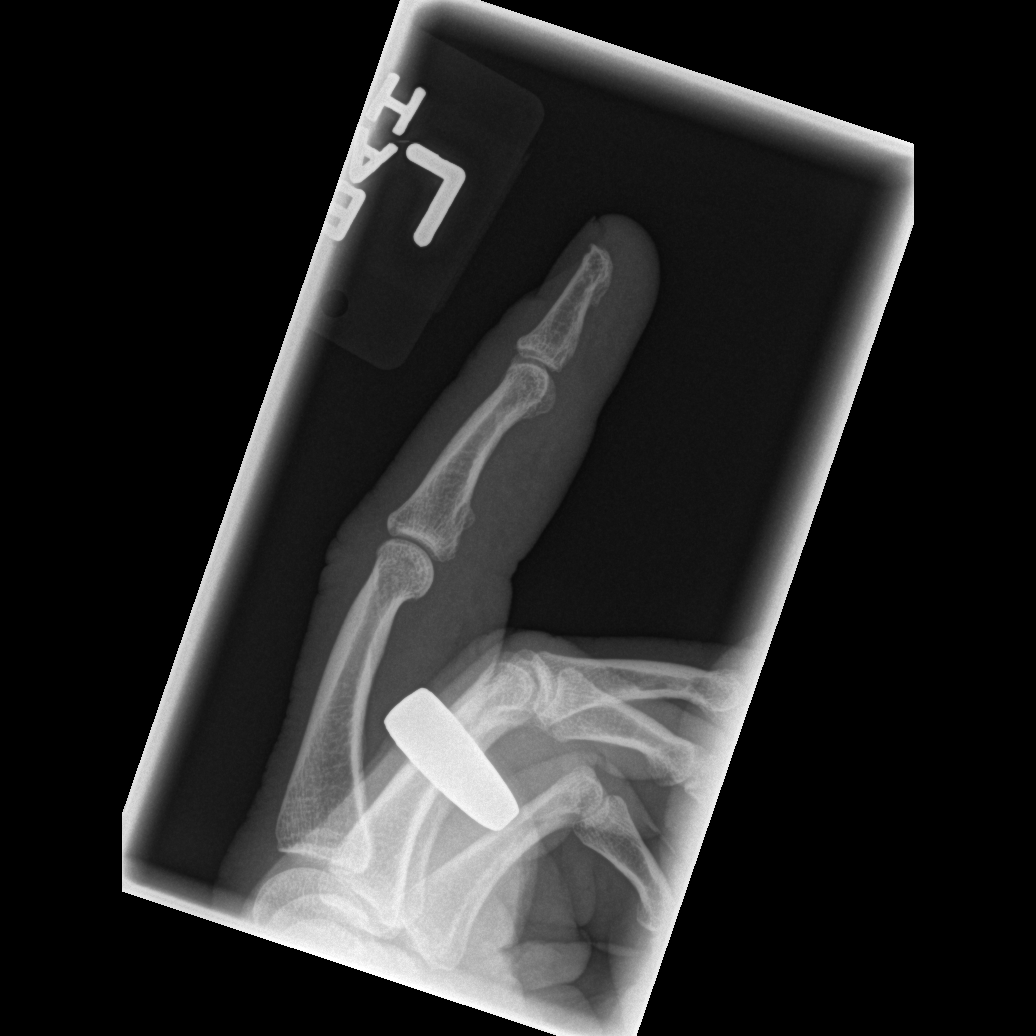

[3 of 3 positions shown; findings below may reference images not displayed]

FINDINGS: Loss of distal articular space, marginal spurring, and
possible marginal erosion noted in the distal interphalangeal joint
of the middle finger.  This area is indistinct on the oblique view.
I do not see a definite fracture.

Small volar osteochondroma of the proximal metaphysis of the middle
phalanx noted.
IMPRESSION: 1.  Cortical irregularity marginally in the head of the middle
phalanx is ascribed to erosion and is not highly characteristic of
a fracture.
2.  Small osteochondroma proximally in the middle phalanx.

## 2012-09-29 NOTE — ED Notes (Signed)
Pt. Stated, I hurt my lt. Middle finger on Friday at work.  Its tender and hard to move the end.  Work sent me

## 2012-09-29 NOTE — ED Notes (Signed)
Patient says an aircraft tire pinned his hand between it and a barbed wire fence and the barbed wire punctured his left middle finger.  The incident happened Friday about 1630hrs and he went to [facility] however, they said they did not handle workman's comp cases that he needed to go to the hospital.  He did not have time to come to the ED then so he went home.  Today, his boss told him he had to be seen here in the ED.  Patient says he is not in a lot of pain, it does hurt him when he tries to bend the finger at the tip, but not at the knuckle.

## 2012-09-29 NOTE — ED Provider Notes (Signed)
History    This chart was scribed for Lyanne Co, MD by Gerlean Ren, ED Scribe. This patient was seen in room TR07C/TR07C and the patient's care was started at 6:51 PM    CSN: [id number]  Arrival date & time 09/29/12  1739   First MD Initiated Contact with Patient 09/29/12 1825      Chief Complaint  Patient presents with  . Finger Injury    The history is provided by the patient. No language interpreter was used.  Mark Melendez is a 52 y.o. male who presents to the Emergency Department complaining of left middle finger laceration sustained suddenly at work 02/21 when heavy object pushed finger against corrugated fence.  Pt reports emergency responders at work washed out the wound and wrapped it with stereo strip immediately following injury.  Pt denies associated redness or fever.  No further injuries as a result.   PCP is Dr. Osa Craver Past Medical History  Diagnosis Date  . Coronary artery disease   . Hypertension     History reviewed. No pertinent past surgical history.  No family history on file.  History  Substance Use Topics  . Smoking status: Not on file  . Smokeless tobacco: Not on file  . Alcohol Use: Yes      Review of Systems A complete 10 system review of systems was obtained and all systems are negative except as noted in the HPI and PMH.   Allergies  Review of patient's allergies indicates no known allergies.  Home Medications   Current Outpatient Rx  Name  Route  Sig  Dispense  Refill  . aspirin 325 MG tablet   Oral   Take 325 mg by mouth daily.         . cetirizine (ZYRTEC) 10 MG tablet   Oral   Take 10 mg by mouth every evening.         . clopidogrel (PLAVIX) 75 MG tablet   Oral   Take 75 mg by mouth daily.         . fish oil-omega-3 fatty acids 1000 MG capsule   Oral   Take 1 g by mouth daily.         Marland Kitchen lisinopril (PRINIVIL,ZESTRIL) 5 MG tablet   Oral   Take 5 mg by mouth daily.         . metoprolol succinate (TOPROL-XL) 25  MG 24 hr tablet   Oral   Take 25 mg by mouth daily.         . Multiple Vitamins-Minerals (MULTIVITAMIN PO)   Oral   Take 1 tablet by mouth daily.         . simvastatin (ZOCOR) 80 MG tablet   Oral   Take 80 mg by mouth at bedtime.           BP 125/90  Pulse 63  Temp(Src) 98.6 F (37 C) (Oral)  SpO2 98%  Physical Exam  Nursing note and vitals reviewed. Constitutional: He is oriented to person, place, and time. He appears well-developed and well-nourished.  HENT:  Head: Normocephalic.  Eyes: EOM are normal.  Neck: Normal range of motion.  Pulmonary/Chest: Effort normal.  Abdominal: He exhibits no distension.  Musculoskeletal: Normal range of motion.  Tenderness over left middle finger DIP joint with small U-shaped laceration overlying dorsal aspect, ecchymosis of nail bed.  Neurological: He is alert and oriented to person, place, and time.  Psychiatric: He has a normal mood and affect.  ED Course  Procedures (including critical care time) DIAGNOSTIC STUDIES: Oxygen Saturation is 98% on room air, normal by my interpretation.    COORDINATION OF CARE: 6:55 PM- Patient informed of clinical course including left middle finger XR.  Pt understands medical decision-making process and agrees with plan.  Informed pt that sutures will not be necessary for laceration.   Dg Finger Middle Left  09/29/2012  *RADIOLOGY REPORT*  Clinical Data: Crushing injury left middle finger.  LEFT MIDDLE FINGER 2+V  Comparison: None.  Findings: Loss of distal articular space, marginal spurring, and possible marginal erosion noted in the distal interphalangeal joint of the middle finger.  This area is indistinct on the oblique view. I do not see a definite fracture.  Small volar osteochondroma of the proximal metaphysis of the middle phalanx noted.  IMPRESSION:  1.  Cortical irregularity marginally in the head of the middle phalanx is ascribed to erosion and is not highly characteristic of a  fracture. 2.  Small osteochondroma proximally in the middle phalanx.   Original Report Authenticated By: Gaylyn Rong, M.D.    I personally reviewed the imaging tests through PACS system I reviewed available ER/hospitalization records through the EMR    1. Injury of middle finger, left, initial encounter       MDM  No signs of infection.  Laceration is healing well.  X-ray without obvious fracture.  Mild pain with range of motion of the DIP joint.  Hand surgery followup.  He understands to return to the ER for signs of infection as we discussed.  Close hand surgery followup for persistent pain or nonhealing.  I personally performed the services described in this documentation, which was scribed in my presence. The recorded information has been reviewed and is accurate.          Lyanne Co, MD 09/29/12 2038

## 2012-11-21 ENCOUNTER — Emergency Department (HOSPITAL_COMMUNITY)
Admission: EM | Admit: 2012-11-21 | Discharge: 2012-11-21 | Disposition: A | Discharge status: Home / Self Care | Payer: BC Managed Care – PPO | Attending: Emergency Medicine | Admitting: Emergency Medicine

## 2012-11-21 ENCOUNTER — Encounter (HOSPITAL_COMMUNITY): Payer: Self-pay | Admitting: *Deleted

## 2012-11-21 DIAGNOSIS — M25562 Pain in left knee: Secondary | ICD-10-CM

## 2012-11-21 DIAGNOSIS — Z79899 Other long term (current) drug therapy: Secondary | ICD-10-CM | POA: Insufficient documentation

## 2012-11-21 DIAGNOSIS — Z7982 Long term (current) use of aspirin: Secondary | ICD-10-CM | POA: Insufficient documentation

## 2012-11-21 DIAGNOSIS — M25569 Pain in unspecified knee: Secondary | ICD-10-CM | POA: Insufficient documentation

## 2012-11-21 DIAGNOSIS — I2510 Atherosclerotic heart disease of native coronary artery without angina pectoris: Secondary | ICD-10-CM | POA: Insufficient documentation

## 2012-11-21 DIAGNOSIS — M533 Sacrococcygeal disorders, not elsewhere classified: Secondary | ICD-10-CM | POA: Insufficient documentation

## 2012-11-21 DIAGNOSIS — I10 Essential (primary) hypertension: Secondary | ICD-10-CM | POA: Insufficient documentation

## 2012-11-21 MED ORDER — HYDROCODONE-ACETAMINOPHEN 5-325 MG PO TABS: 1.0000 | ORAL_TABLET | ORAL | Status: DC | PRN

## 2012-11-21 NOTE — ED Notes (Signed)
Pt has had ache/pain left hip for three weeks.  About 2 weeks ago the same pain in left knee.  Pt thought it was due to exercise but the pain is getting worse and will not go away.  Pain wakes pt up out of sleep.

## 2012-11-21 NOTE — ED Provider Notes (Signed)
Medical screening examination/treatment/procedure(s) were performed by non-physician practitioner and as supervising physician I was immediately available for consultation/collaboration.    Nelia Shi, MD 11/21/12 [phone]

## 2012-11-21 NOTE — ED Provider Notes (Signed)
History     CSN: [id number]  Arrival date & time 11/21/12  4098   First MD Initiated Contact with Patient 11/21/12 [phone]      Chief Complaint  Patient presents with  . Hip Pain  . Knee Pain    (Consider location/radiation/quality/duration/timing/severity/associated sxs/prior treatment) HPI  52 year old male with history of CAD, and hypertension presents complaining of left hip pain. Patient reports last month he started exercise with his daughter, running about a mile and a half every other day.  After 2 weeks into his regimen he begins to develop pain to his left hip/left buttock. Pain is described as a sharp stabbing sensation that is intermittent. Last week he developed increasing pain to both buttock but now to his left knee. He noticed that the pain seems to improve when he walks but worsened when he lays on the affected area. Pain intensified this morning, waking him up from sleep. Pain is 9/10. There is no associated fever, chills, chest pain, shortness of breath, back pain, abdominal pain, urinary or bowel incontinence, saddle paresthesia, numbness or weakness. He has tried taking ibuprofen with some relief but it has not was fully resolved. In fact his pain is getting progressively worse despite not exercise for the past 2 weeks. He also take Plavix due to history of coronary stent placement. Denies hematochezia, or melena.  Past Medical History  Diagnosis Date  . Coronary artery disease   . Hypertension     Past Surgical History  Procedure Laterality Date  . Coronary stent placement      4 stents    No family history on file.  History  Substance Use Topics  . Smoking status: Never Smoker   . Smokeless tobacco: Not on file  . Alcohol Use: Yes      Review of Systems  Constitutional:       A complete 10 system review of systems was obtained and all systems are negative except as noted in the HPI and PMH.    Allergies  Review of patient's allergies indicates no  known allergies.  Home Medications   Current Outpatient Rx  Name  Route  Sig  Dispense  Refill  . aspirin 325 MG tablet   Oral   Take 325 mg by mouth daily.         . cetirizine (ZYRTEC) 10 MG tablet   Oral   Take 10 mg by mouth every evening.         . clopidogrel (PLAVIX) 75 MG tablet   Oral   Take 75 mg by mouth daily.         . fish oil-omega-3 fatty acids 1000 MG capsule   Oral   Take 1 g by mouth daily.         Marland Kitchen lisinopril (PRINIVIL,ZESTRIL) 5 MG tablet   Oral   Take 5 mg by mouth daily.         . metoprolol succinate (TOPROL-XL) 25 MG 24 hr tablet   Oral   Take 25 mg by mouth daily.         . Multiple Vitamins-Minerals (MULTIVITAMIN PO)   Oral   Take 1 tablet by mouth daily.         . simvastatin (ZOCOR) 80 MG tablet   Oral   Take 80 mg by mouth at bedtime.           BP 140/62  Pulse 72  Temp(Src) 98.6 F (37 C) (Oral)  Resp 20  SpO2 100%  Physical Exam  Nursing note and vitals reviewed. Constitutional: He is oriented to person, place, and time.  Awake, alert, nontoxic appearance  HENT:  Head: Atraumatic.  Eyes: Right eye exhibits no discharge. Left eye exhibits no discharge.  Neck: Neck supple.  Cardiovascular: Normal rate, regular rhythm and intact distal pulses.  Exam reveals no gallop and no friction rub.   No murmur heard. Pulmonary/Chest: Effort normal. He exhibits no tenderness.  Abdominal: There is no tenderness. There is no rebound.  No palpable pulsatile abdominal mass  Musculoskeletal: He exhibits tenderness (Left buttock: Point tenderness to sacroiliac region worsening with hip flexion however maintain normal range of motion, no overlying skin changes. Left knee: Mild tenderness noted to lateral aspects of patella without any significant swelling noted. ). He exhibits no edema.  Baseline ROM, no obvious new focal weakness  Bilateral lower shoes without palpable cords, erythema, calf tenderness, or leg swelling.  Negative Homans sign.  Bilateral knees without joint laxity, or edema.  Neurological: He is alert and oriented to person, place, and time.  Mental status and motor strength appears baseline for patient and situation  Skin: No rash noted.  Psychiatric: He has a normal mood and affect.    ED Course  Procedures (including critical care time)  7:27 AM Patient presents complaining of progressive pain to his left buttock, and left knee. Pain is reproducible on exam and worsening with range of motion however patient maintain normal range of motion. Patient is able to ambulate without difficulty. No red flags. Doubt cardiopulmonary disease or aortic disease. Pain likely MSK consider recent exercise regimen.  Plan to d/c with pain medication and RICE therapy.  Pt to f/u with ortho as needed.  Return precaution include fever, rash, numbness, cp, sob.  Pt voice understanding and agrees with plan.    Labs Reviewed - No data to display No results found.   1. Left lateral knee pain   2. Sacroiliac pain       MDM  BP 140/62  Pulse 72  Temp(Src) 98.6 F (37 C) (Oral)  Resp 20  SpO2 100%          Fayrene Helper, PA-C 11/21/12 0732

## 2014-03-08 ENCOUNTER — Other Ambulatory Visit: Payer: Self-pay | Admitting: Family

## 2014-03-08 ENCOUNTER — Ambulatory Visit
Admission: RE | Admit: 2014-03-08 | Discharge: 2014-03-08 | Disposition: A | Discharge status: Home / Self Care | Payer: BC Managed Care – PPO | Source: Ambulatory Visit | Attending: Family | Admitting: Family

## 2014-03-08 DIAGNOSIS — M79643 Pain in unspecified hand: Secondary | ICD-10-CM

## 2014-03-08 IMAGING — CR DG FINGER INDEX 2+V*R*
3 series · 3 of 3 positions shown · non-contrast
Comparison: None.

CLINICAL DATA: Pain, decreased range of motion within the index
finger

EXAM:
RIGHT INDEX FINGER 2+V

[view not recorded (1 of 3)]
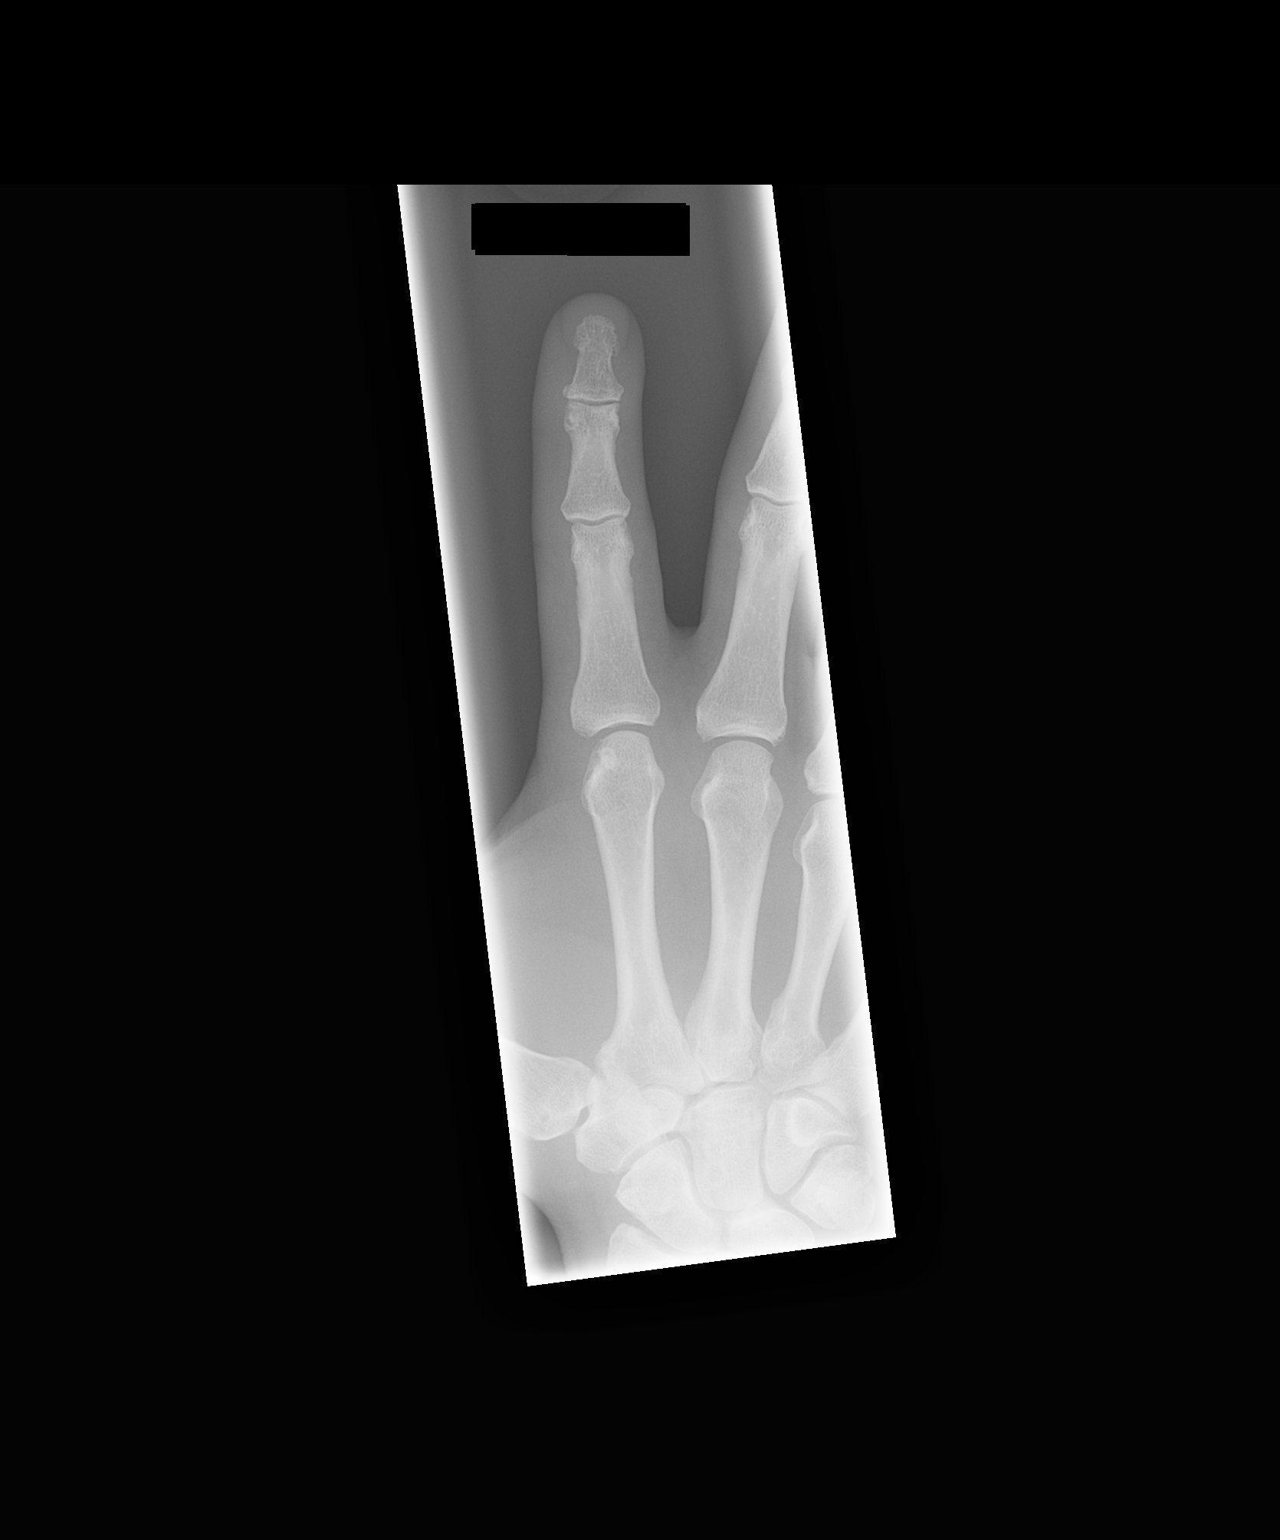

[view not recorded (2 of 3)]
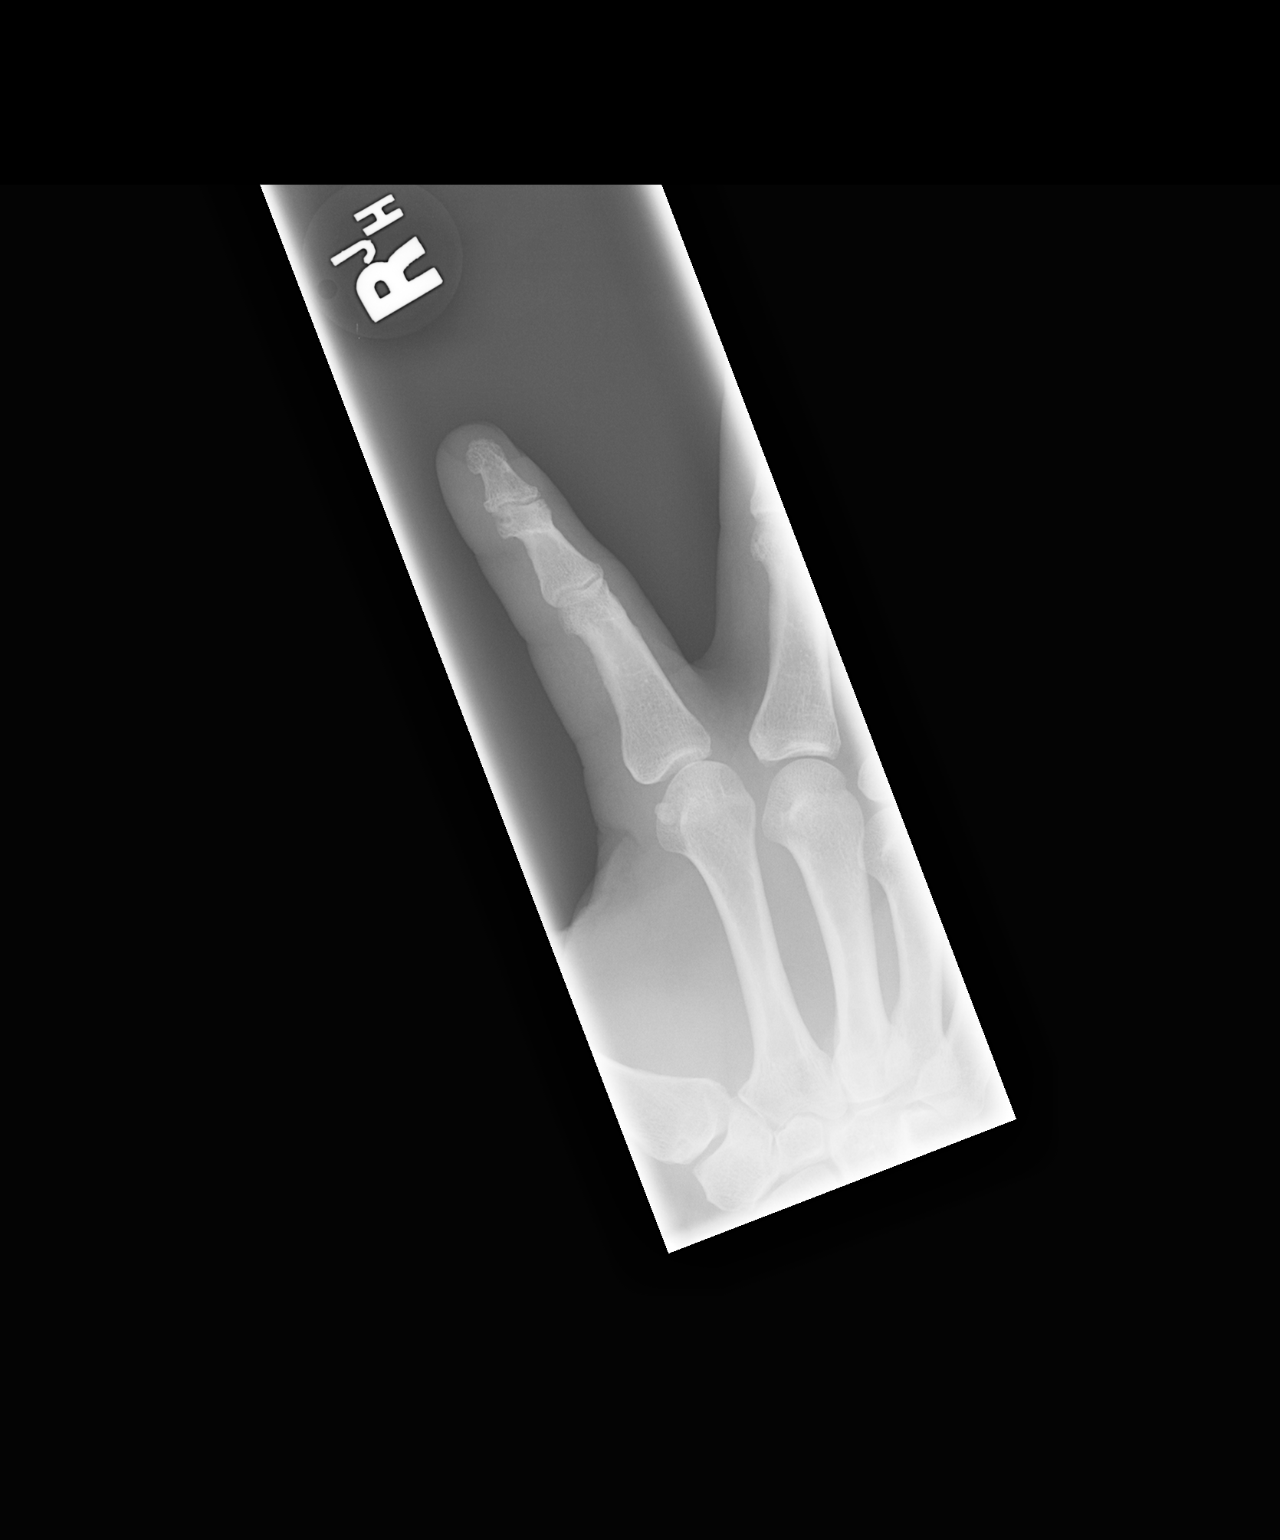

[view not recorded (3 of 3)]
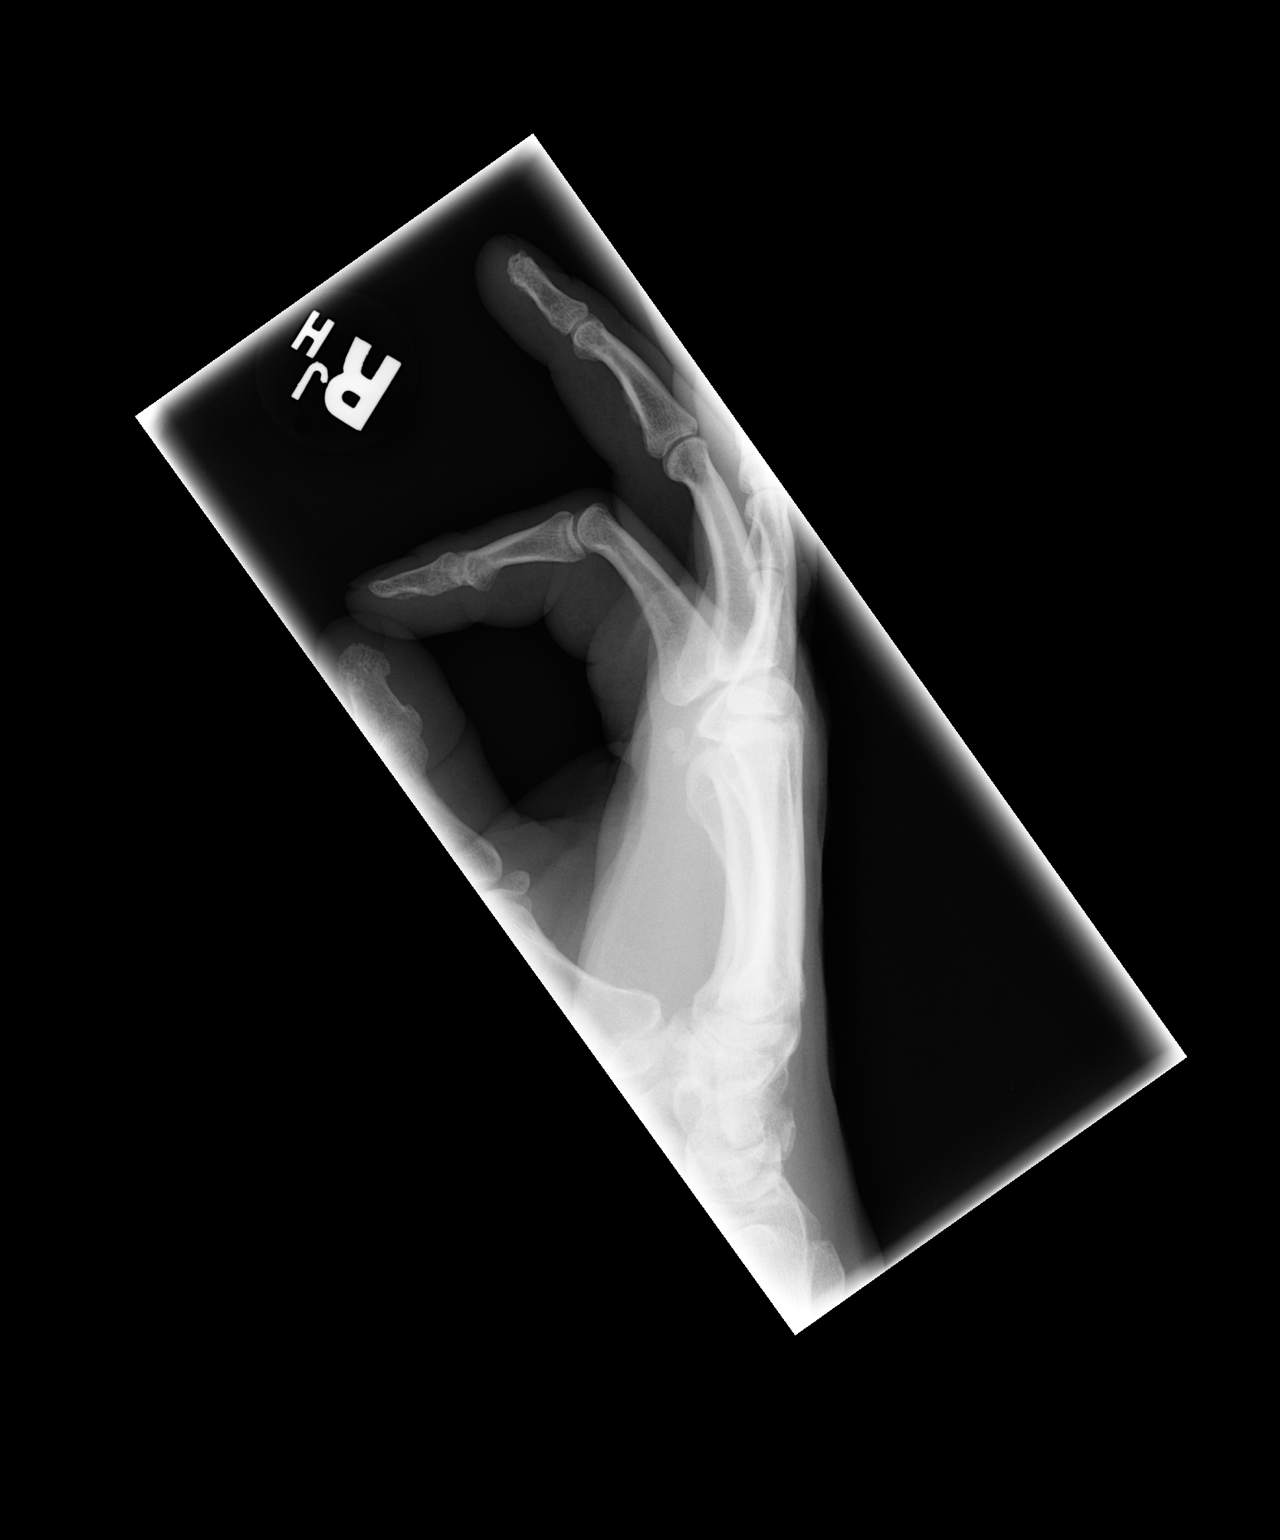

[3 of 3 positions shown; findings below may reference images not displayed]

FINDINGS: No acute bony abnormality. Specifically, no fracture, subluxation,
or dislocation. Soft tissues are intact.
IMPRESSION: No acute bony abnormality.

## 2014-03-08 IMAGING — CR DG FINGER INDEX 2+V*L*
3 series · 3 of 3 positions shown · non-contrast
Comparison: None.

CLINICAL DATA: Left second finger pain.

EXAM:
LEFT INDEX FINGER 2+V

[view not recorded (1 of 3)]
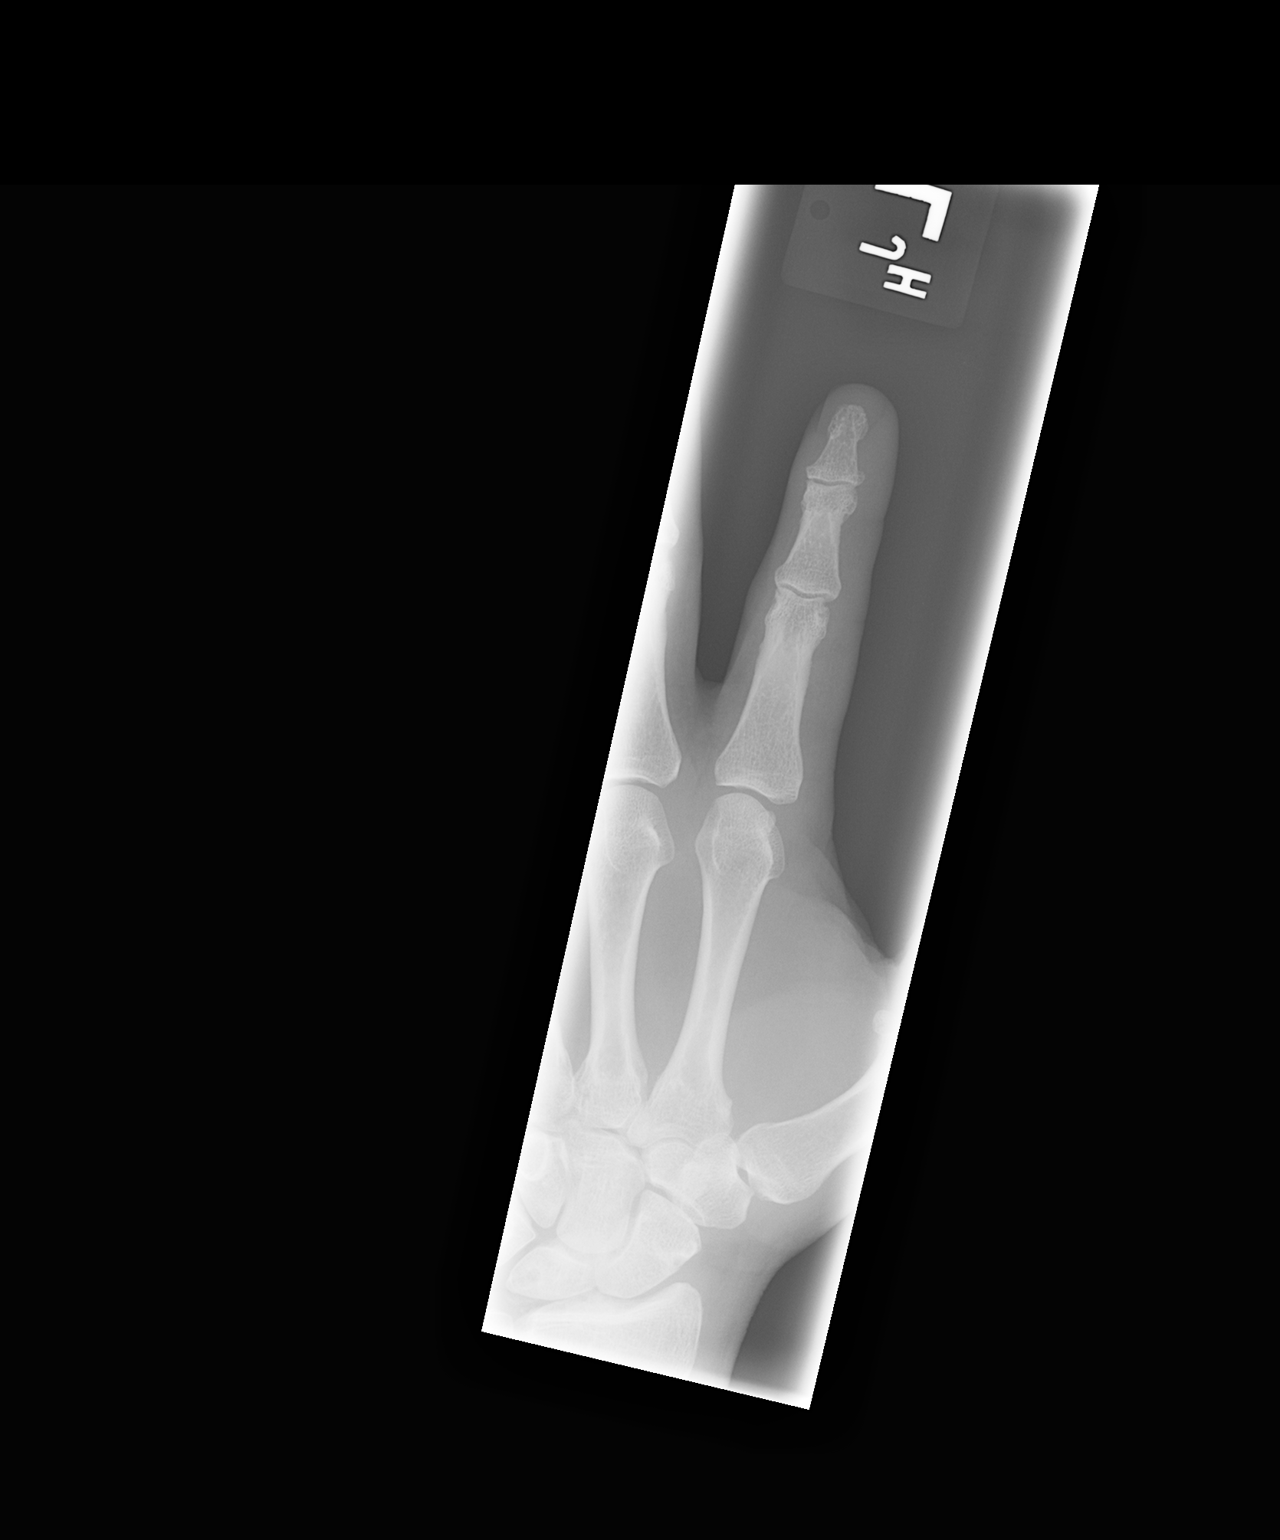

[view not recorded (2 of 3)]
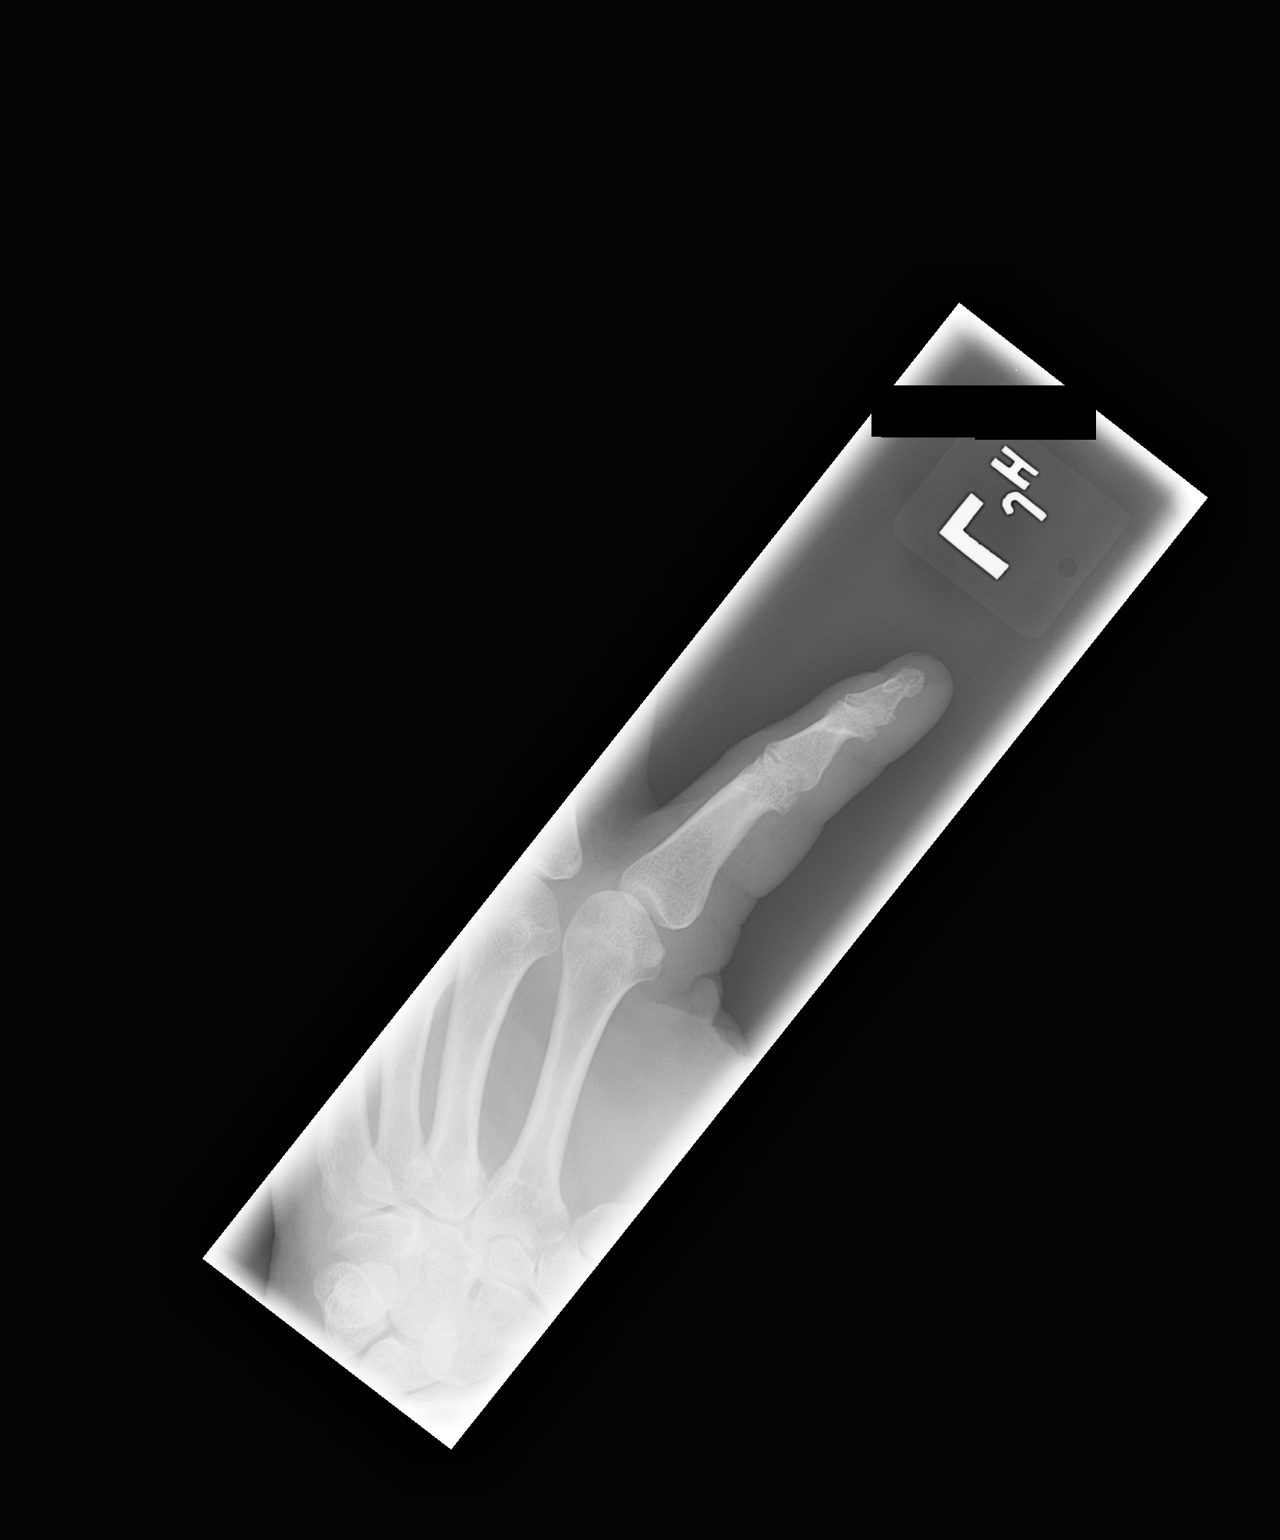

[view not recorded (3 of 3)]
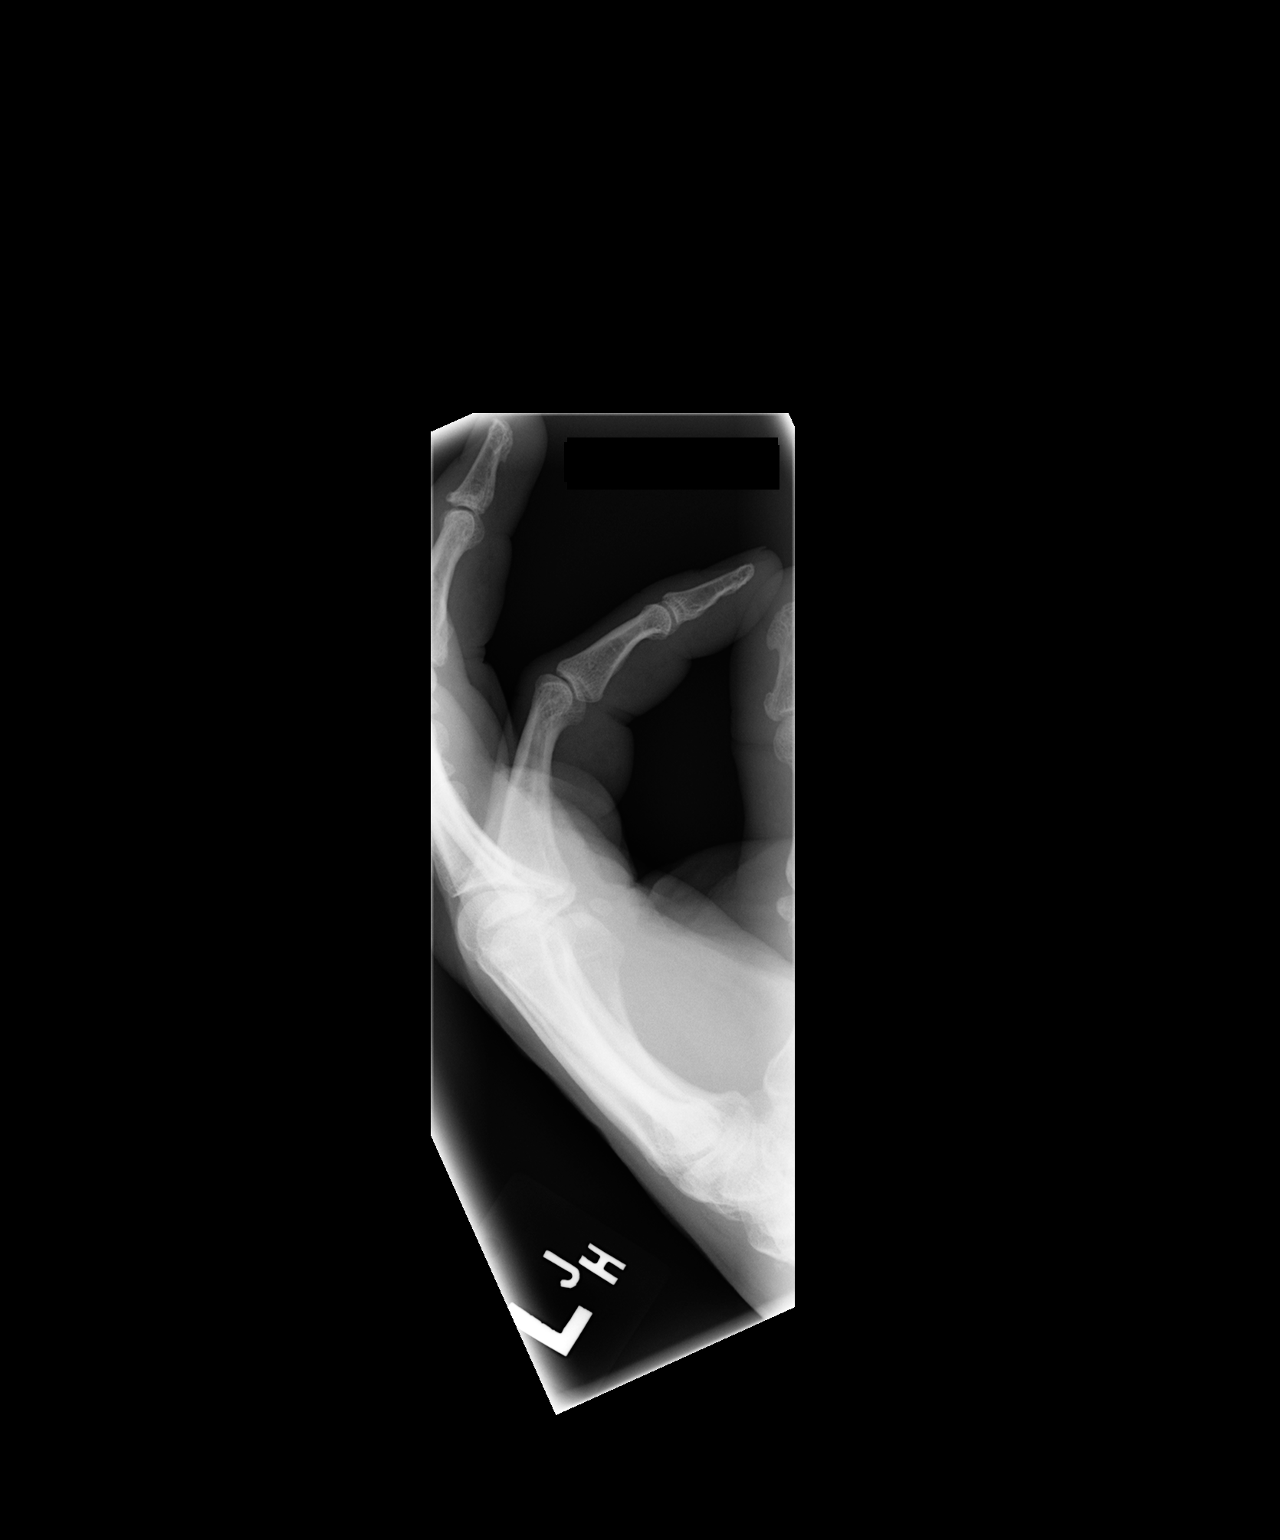

[3 of 3 positions shown; findings below may reference images not displayed]

FINDINGS: No acute fracture or dislocation identified. There is osteoarthritis
involving the DIP and PIP joints. Soft tissues are unremarkable. No
bony lesions or erosions are identified.
IMPRESSION: Osteoarthritis of the left second DIP and PIP joints.

## 2015-01-20 ENCOUNTER — Other Ambulatory Visit: Payer: Self-pay | Admitting: Family

## 2015-01-20 ENCOUNTER — Ambulatory Visit
Admission: RE | Admit: 2015-01-20 | Discharge: 2015-01-20 | Disposition: A | Discharge status: Home / Self Care | Payer: BLUE CROSS/BLUE SHIELD | Source: Ambulatory Visit | Attending: Family | Admitting: Family

## 2015-01-20 DIAGNOSIS — S93402A Sprain of unspecified ligament of left ankle, initial encounter: Secondary | ICD-10-CM

## 2015-01-20 IMAGING — CR DG ANKLE COMPLETE 3+V*L*
3 series · 3 of 3 positions shown · non-contrast
Comparison: MRI 12/05/2010.

CLINICAL DATA: Twisted left ankle.

EXAM:
LEFT ANKLE COMPLETE - 3+ VIEW

[x ankle ap left]
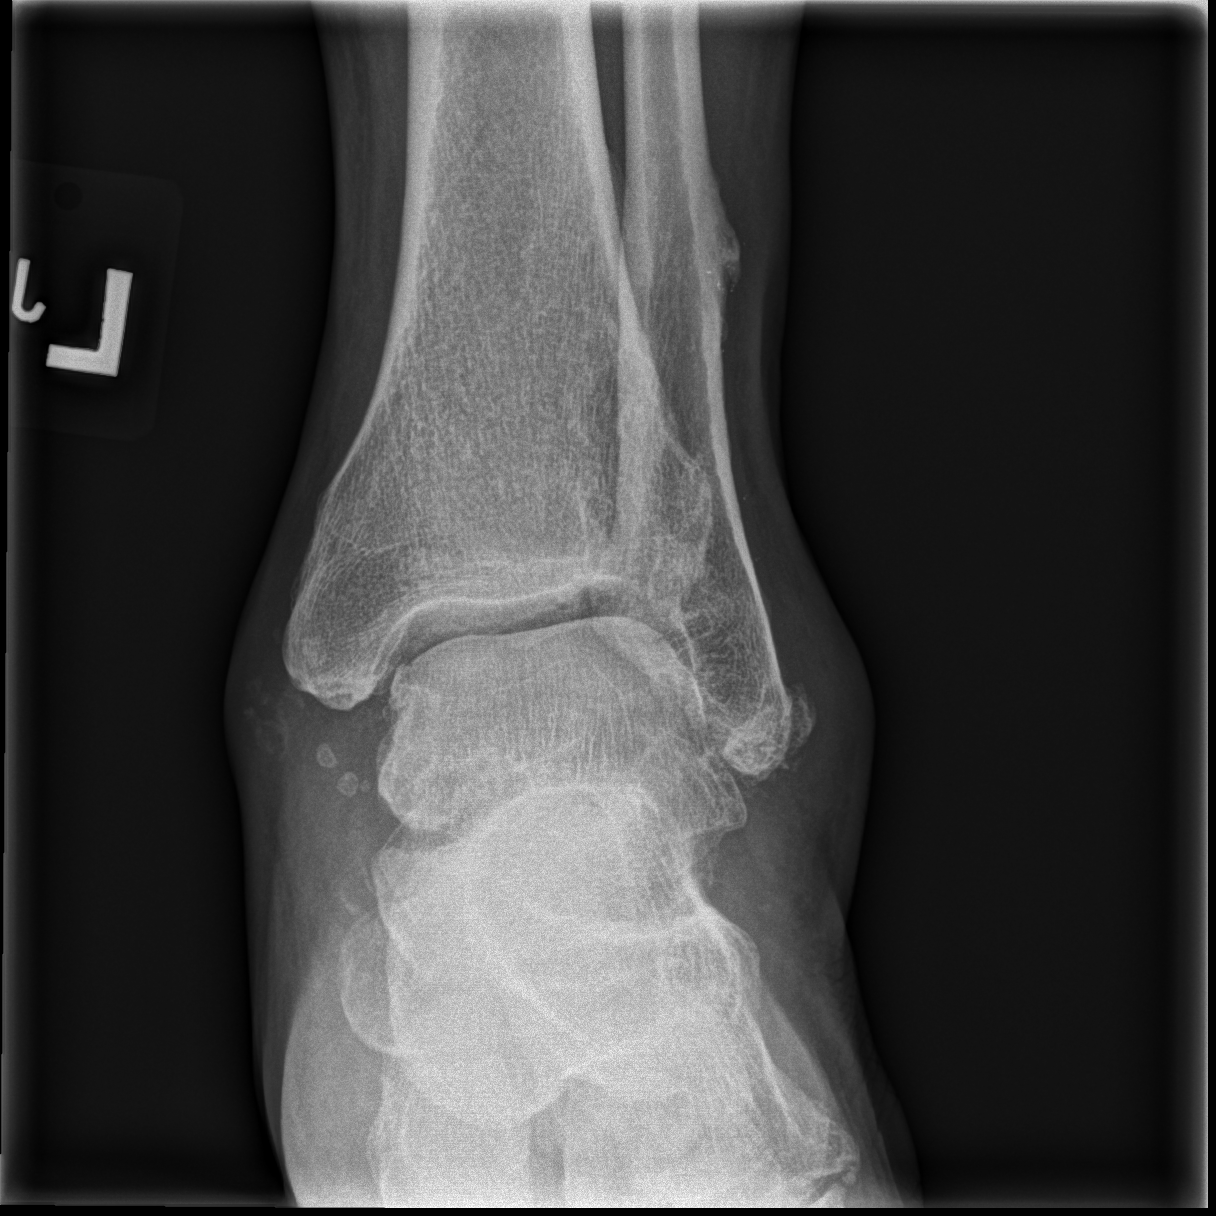

[x ankle obl left]
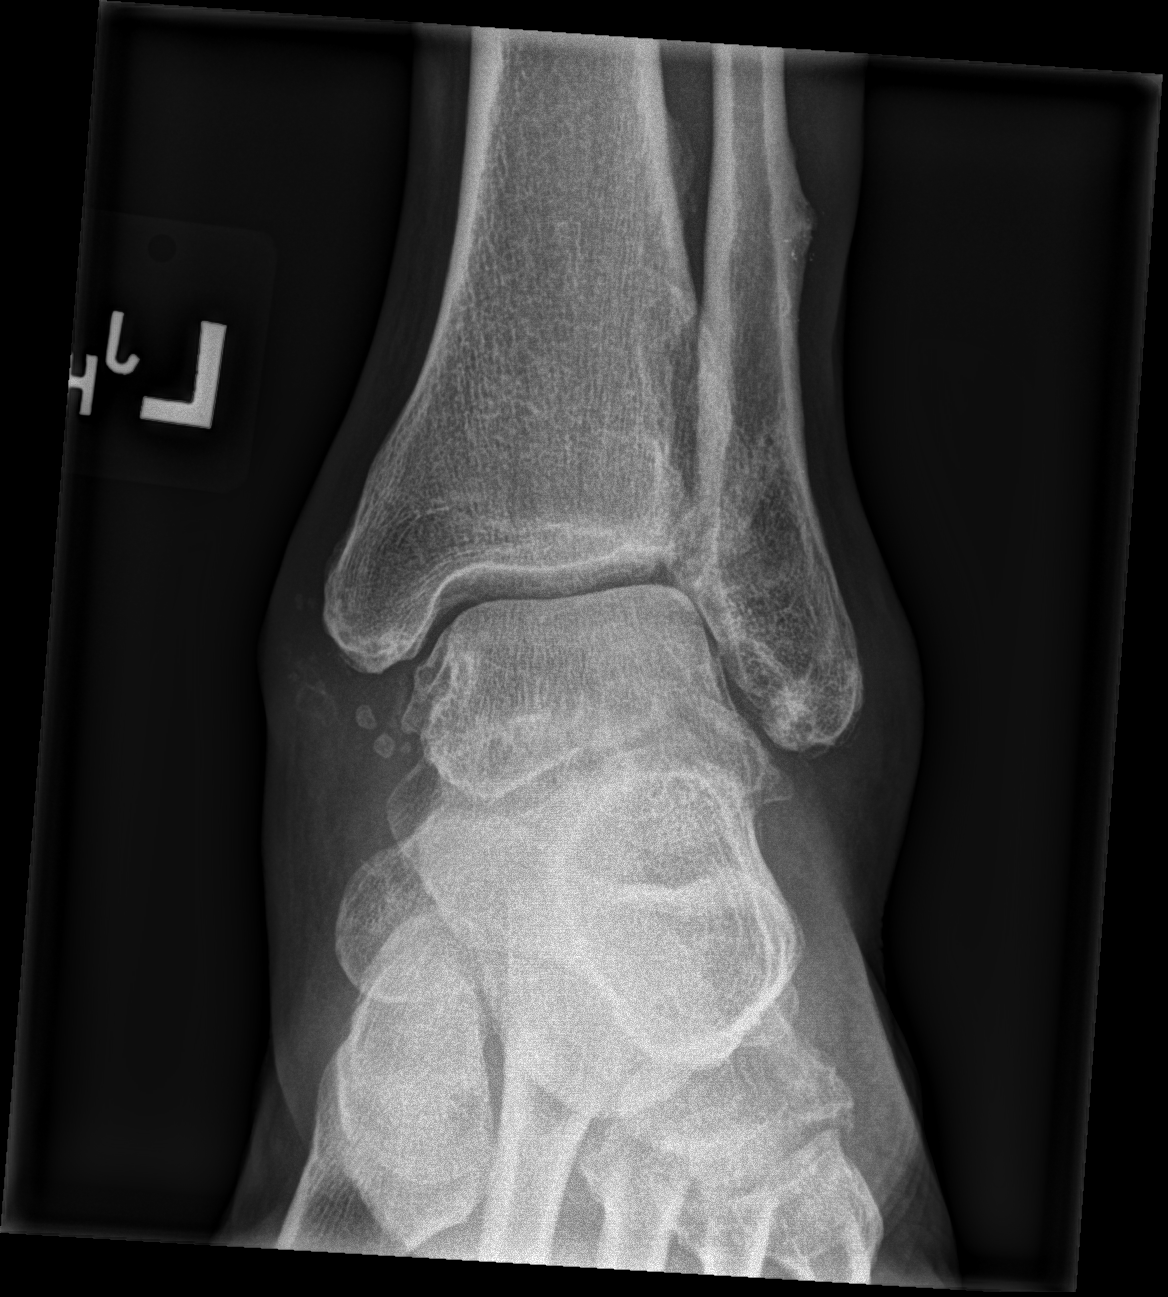

[x ankle lat left]
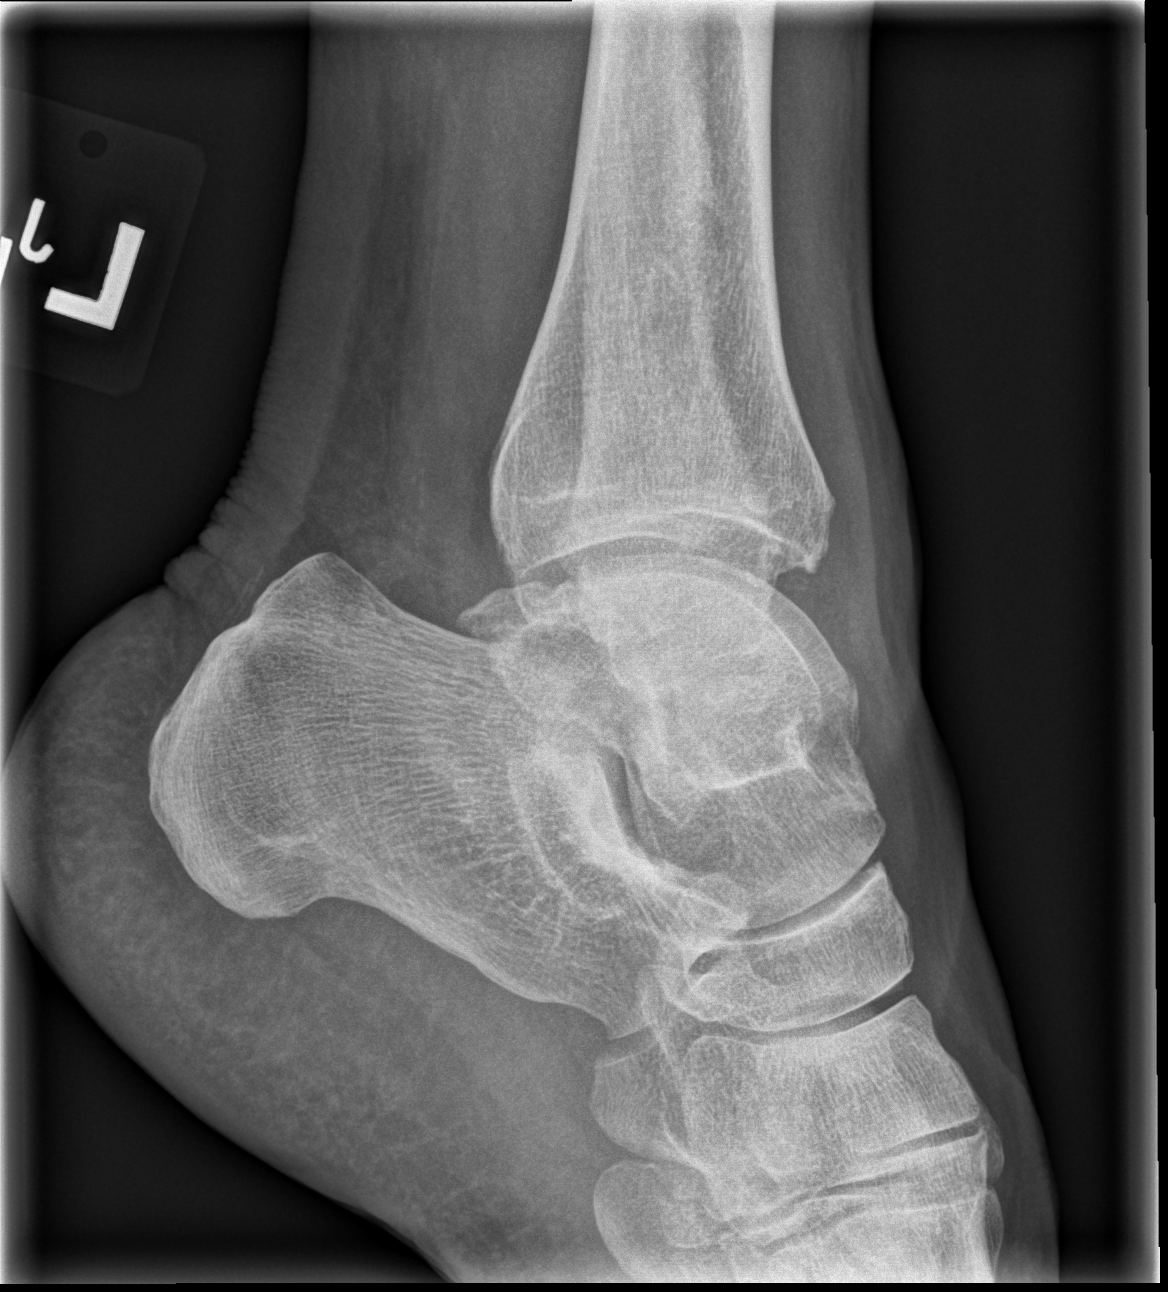

[3 of 3 positions shown; findings below may reference images not displayed]

FINDINGS: Diffuse soft tissue swelling. Old posttraumatic changes noted about
the left ankle with old healed fractures and displaced old fracture
fragments. No acute fracture identified. Diffuse osteopenia
degenerative change.
IMPRESSION: Diffuse osteopenia and degenerative change with old posttraumatic
changes. No acute abnormality identified.

## 2015-03-13 ENCOUNTER — Emergency Department (HOSPITAL_COMMUNITY): Payer: BLUE CROSS/BLUE SHIELD

## 2015-03-13 ENCOUNTER — Encounter (HOSPITAL_COMMUNITY): Payer: Self-pay

## 2015-03-13 ENCOUNTER — Observation Stay (HOSPITAL_COMMUNITY)
Admission: EM | Admit: 2015-03-13 | Discharge: 2015-03-14 | Disposition: A | Discharge status: Home / Self Care | Payer: BLUE CROSS/BLUE SHIELD | Attending: Cardiology | Admitting: Cardiology

## 2015-03-13 DIAGNOSIS — E780 Pure hypercholesterolemia: Secondary | ICD-10-CM | POA: Insufficient documentation

## 2015-03-13 DIAGNOSIS — Z7902 Long term (current) use of antithrombotics/antiplatelets: Secondary | ICD-10-CM | POA: Insufficient documentation

## 2015-03-13 DIAGNOSIS — R7309 Other abnormal glucose: Secondary | ICD-10-CM | POA: Insufficient documentation

## 2015-03-13 DIAGNOSIS — I25119 Atherosclerotic heart disease of native coronary artery with unspecified angina pectoris: Secondary | ICD-10-CM | POA: Diagnosis not present

## 2015-03-13 DIAGNOSIS — K219 Gastro-esophageal reflux disease without esophagitis: Secondary | ICD-10-CM | POA: Diagnosis not present

## 2015-03-13 DIAGNOSIS — R079 Chest pain, unspecified: Secondary | ICD-10-CM | POA: Diagnosis present

## 2015-03-13 DIAGNOSIS — Z79899 Other long term (current) drug therapy: Secondary | ICD-10-CM | POA: Diagnosis not present

## 2015-03-13 DIAGNOSIS — Z79891 Long term (current) use of opiate analgesic: Secondary | ICD-10-CM | POA: Insufficient documentation

## 2015-03-13 DIAGNOSIS — I249 Acute ischemic heart disease, unspecified: Secondary | ICD-10-CM | POA: Diagnosis present

## 2015-03-13 DIAGNOSIS — I10 Essential (primary) hypertension: Secondary | ICD-10-CM | POA: Insufficient documentation

## 2015-03-13 DIAGNOSIS — Z7982 Long term (current) use of aspirin: Secondary | ICD-10-CM | POA: Insufficient documentation

## 2015-03-13 DIAGNOSIS — Z955 Presence of coronary angioplasty implant and graft: Secondary | ICD-10-CM | POA: Insufficient documentation

## 2015-03-13 DIAGNOSIS — I252 Old myocardial infarction: Secondary | ICD-10-CM | POA: Insufficient documentation

## 2015-03-13 LAB — CBC
HCT: 42.3 % (ref 39.0–52.0)
Hemoglobin: 14.7 g/dL (ref 13.0–17.0)
MCH: 30 pg (ref 26.0–34.0)
MCHC: 34.8 g/dL (ref 30.0–36.0)
MCV: 86.3 fL (ref 78.0–100.0)
Platelets: 219 10*3/uL (ref 150–400)
RBC: 4.9 MIL/uL (ref 4.22–5.81)
RDW: 13.5 % (ref 11.5–15.5)
WBC: 6.9 10*3/uL (ref 4.0–10.5)

## 2015-03-13 LAB — BASIC METABOLIC PANEL
ANION GAP: 8 (ref 5–15)
BUN: 16 mg/dL (ref 6–20)
CALCIUM: 9.6 mg/dL (ref 8.9–10.3)
CHLORIDE: 107 mmol/L (ref 101–111)
CO2: 24 mmol/L (ref 22–32)
CREATININE: 0.9 mg/dL (ref 0.61–1.24)
GFR calc Af Amer: >60 mL/min (ref >60)
GFR calc non Af Amer: >60 mL/min (ref >60)
Glucose, Bld: 117 mg/dL — ABNORMAL HIGH (ref 65–99)
POTASSIUM: 4.3 mmol/L (ref 3.5–5.1)
SODIUM: 139 mmol/L (ref 135–145)

## 2015-03-13 LAB — I-STAT TROPONIN, ED: TROPONIN I, POC: 0.01 ng/mL (ref 0.00–0.08)

## 2015-03-13 IMAGING — DX DG CHEST 2V
2 series · 2 of 2 positions shown · non-contrast
Comparison: December 19, 2010

CLINICAL DATA: Shortness of breath with substernal chest pain

EXAM:
CHEST  2 VIEW

[chest pa]
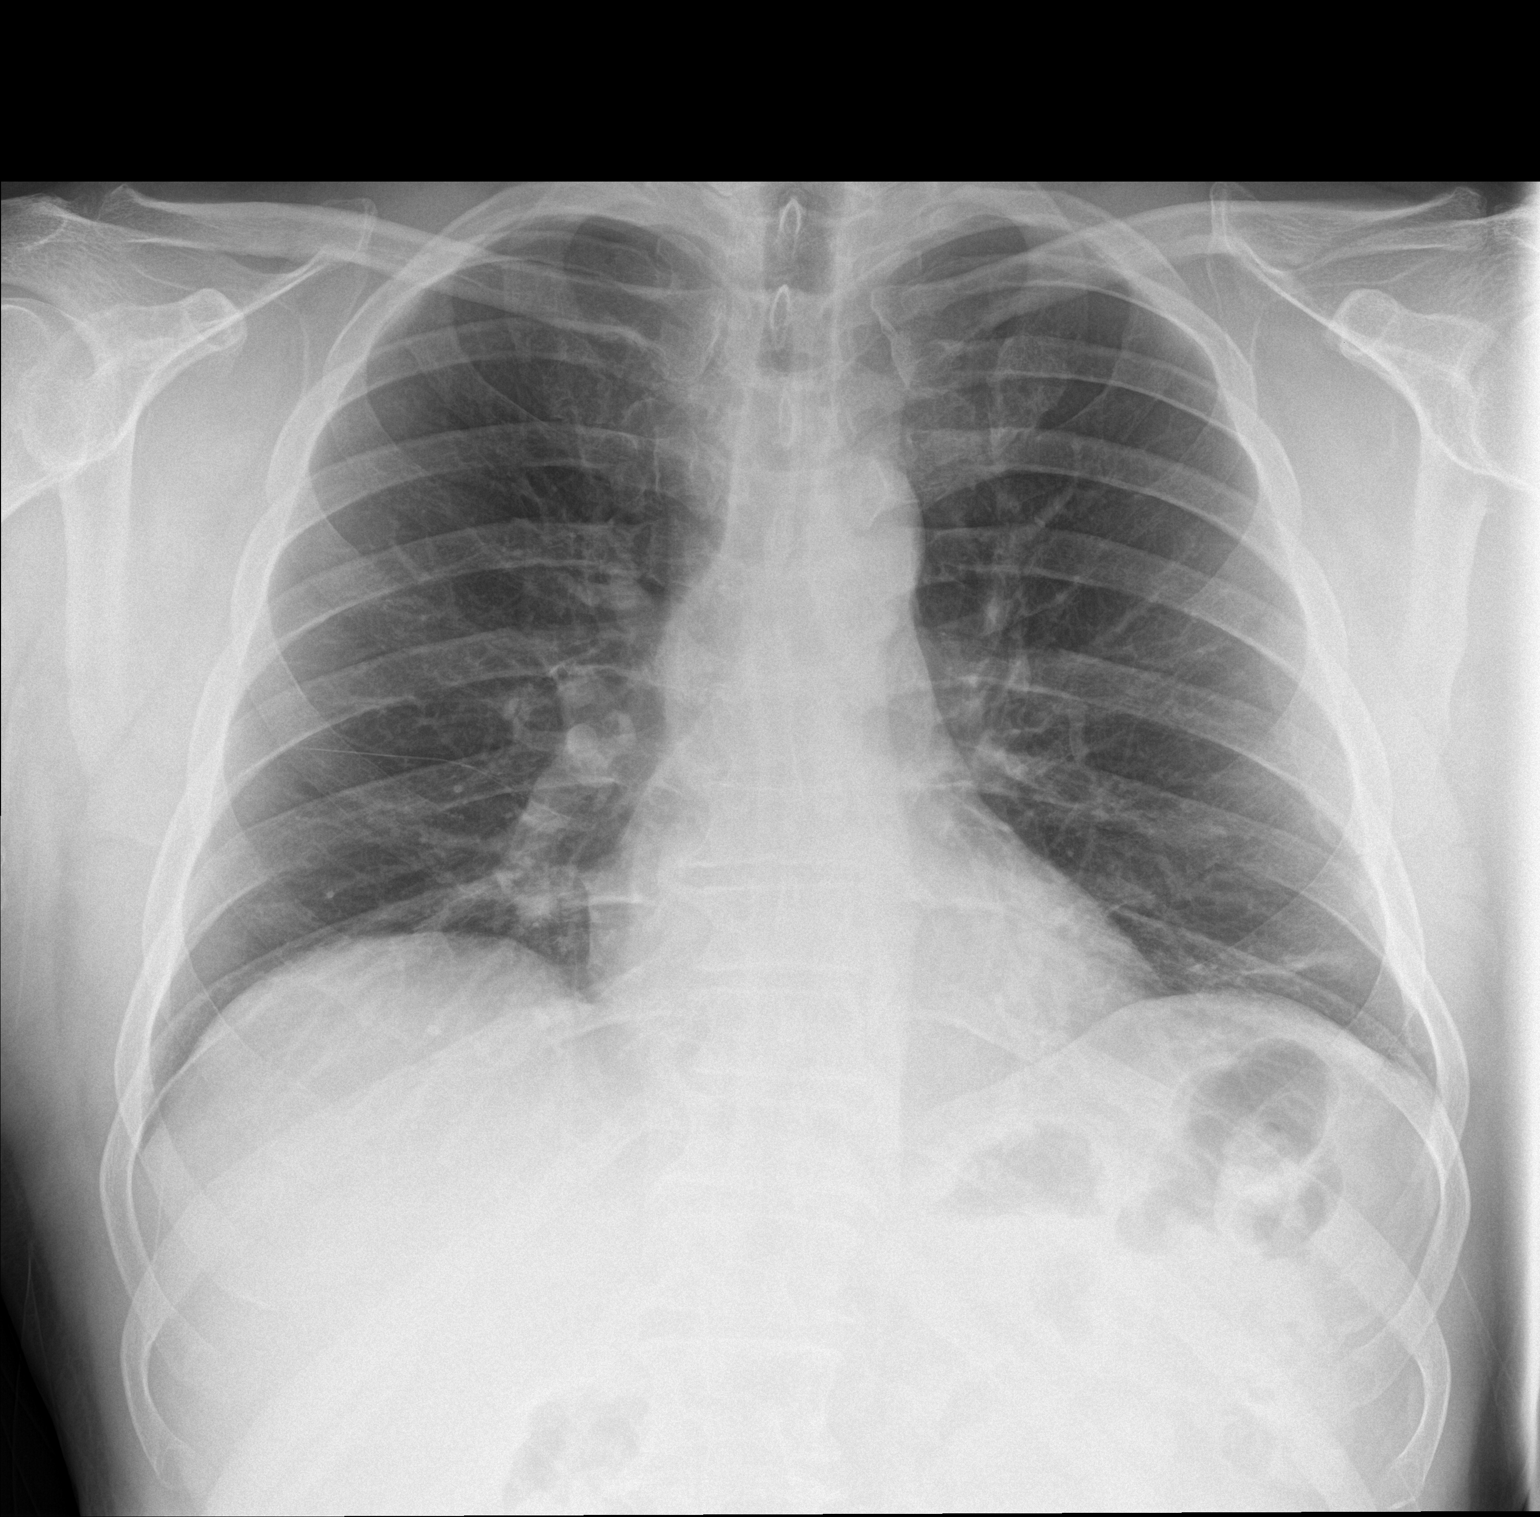

[chest lat]
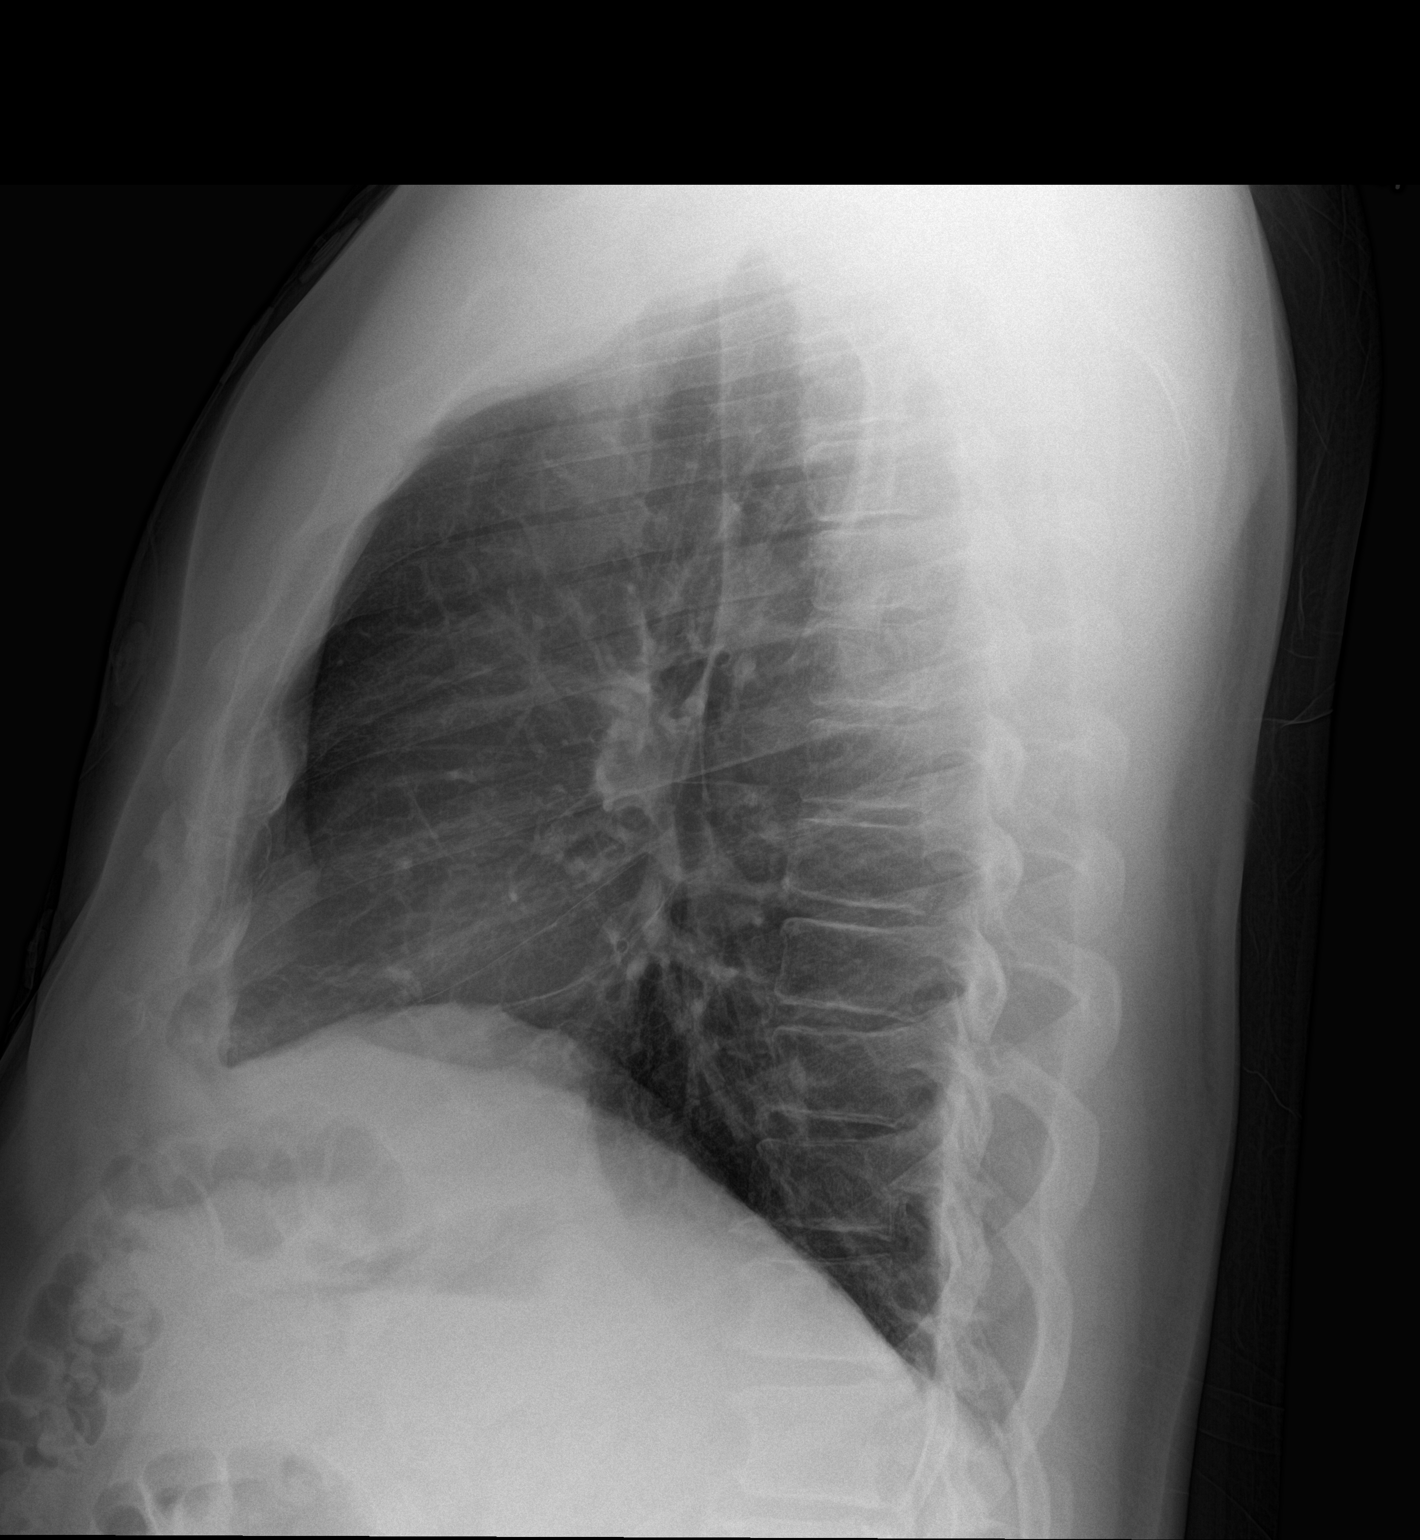

[2 of 2 positions shown; findings below may reference images not displayed]

FINDINGS: There is atelectasis in the left base. Lungs elsewhere clear. Heart
size and pulmonary vascularity are normal. No adenopathy. No bone
lesions.
IMPRESSION: Left base atelectasis.  Lungs elsewhere clear.

## 2015-03-13 NOTE — ED Notes (Addendum)
Pt reports midsternal CP that radiates to back and left shoulder blade; pt reports hx of heart blockages with cardiac cath performed; pt reports pain as burning sensation and suspects heartburn; pt took one SL nitro with improvement in symptoms; pt reports "I have 4 stents total in my heart" pt reports currently being pain free

## 2015-03-13 NOTE — ED Provider Notes (Signed)
CSN: [id number]     Arrival date & time 03/13/15  2209 History   This chart was scribed for Mark Lyons, MD by Evon Slack, ED Scribe. This patient was seen in room A04C/A04C and the patient's care was started at 11:13 PM.     Chief Complaint  Patient presents with  . Chest Pain   Patient is a 54 y.o. male presenting with chest pain. The history is provided by the patient. No language interpreter was used.  Chest Pain Pain location:  Substernal area Pain quality: burning and pressure   Pain radiates to:  L shoulder Pain severity:  Mild Onset quality:  Gradual Progression:  Resolved Chronicity:  Recurrent Context: at rest   Relieved by:  Nitroglycerin Worsened by:  Nothing tried Associated symptoms: headache and shortness of breath   Associated symptoms: no diaphoresis, no nausea and not vomiting    HPI Comments: Mark Melendez is a 54 y.o. male who presents to the Emergency Department complaining of resolved  CP onset earlier tonight. Pt states that the pain did radiated up towards the back of his left shoulder blade. Pt states that during the onset of pain he was getting ready for bed. Pt states that he did feel slightly SOB during the onset of pain. Pt reports Hx of similar pain and had to have stents placed. Pt reports taking 1 nitroglycerni that has p[rovided some relief. Pt states that he has had relief of symptoms but still currently has a HA. Pt denies nausea, vomiting or diaphoresis Hx of stent placed in 2008 and 2010. Pt states that he is in Plavix.    Past Medical History  Diagnosis Date  . Coronary artery disease   . Hypertension    Past Surgical History  Procedure Laterality Date  . Coronary stent placement      4 stents   History reviewed. No pertinent family history. History  Substance Use Topics  . Smoking status: Never Smoker   . Smokeless tobacco: Not on file  . Alcohol Use: Yes    Review of Systems  Constitutional: Negative for diaphoresis.   Respiratory: Positive for shortness of breath.   Cardiovascular: Positive for chest pain.  Gastrointestinal: Negative for nausea and vomiting.  Neurological: Positive for headaches.  All other systems reviewed and are negative.     Allergies  Review of patient's allergies indicates no known allergies.  Home Medications   Prior to Admission medications   Medication Sig Start Date End Date Taking? Authorizing Provider  aspirin 325 MG tablet Take 325 mg by mouth daily.    Historical Provider, MD  cetirizine (ZYRTEC) 10 MG tablet Take 10 mg by mouth every evening.    Historical Provider, MD  clopidogrel (PLAVIX) 75 MG tablet Take 75 mg by mouth daily.    Historical Provider, MD  fish oil-omega-3 fatty acids 1000 MG capsule Take 1 g by mouth daily.    Historical Provider, MD  HYDROcodone-acetaminophen (NORCO/VICODIN) 5-325 MG per tablet Take 1 tablet by mouth every 4 (four) hours as needed for pain. 11/21/12   Fayrene Helper, PA-C  lisinopril (PRINIVIL,ZESTRIL) 5 MG tablet Take 5 mg by mouth daily.    Historical Provider, MD  metoprolol succinate (TOPROL-XL) 25 MG 24 hr tablet Take 25 mg by mouth daily.    Historical Provider, MD  Multiple Vitamins-Minerals (MULTIVITAMIN PO) Take 1 tablet by mouth daily.    Historical Provider, MD  simvastatin (ZOCOR) 80 MG tablet Take 80 mg by mouth at bedtime.  Historical Provider, MD   BP 138/79 mmHg  Pulse 67  Temp(Src) 98.6 F (37 C) (Oral)  Resp 20  Ht  (1.778 m)  Wt 219 lb 12.8 oz (99.701 kg)  BMI 31.54 kg/m2  SpO2 95%   Physical Exam  Constitutional: He is oriented to person, place, and time. He appears well-developed and well-nourished. No distress.  HENT:  Head: Normocephalic and atraumatic.  Eyes: Conjunctivae and EOM are normal.  Neck: Neck supple. No tracheal deviation present.  Cardiovascular: Normal rate.   Pulmonary/Chest: Effort normal. No respiratory distress.  Musculoskeletal: Normal range of motion.  Neurological: He  is alert and oriented to person, place, and time.  Skin: Skin is warm and dry.  Psychiatric: He has a normal mood and affect. His behavior is normal.  Nursing note and vitals reviewed.   ED Course  Procedures (including critical care time) DIAGNOSTIC STUDIES: Oxygen Saturation is 95% on RA, normal by my interpretation.    COORDINATION OF CARE: 11:18 PM-Discussed treatment plan with pt at bedside and pt agreed to plan.     Labs Review Labs Reviewed  BASIC METABOLIC PANEL - Abnormal; Notable for the following:    Glucose, Bld 117 (*)    All other components within normal limits  CBC  I-STAT TROPOININ, ED    Imaging Review No results found.   EKG Interpretation   Date/Time:  Sunday March 13 2015 22:15:34 EDT Ventricular Rate:  68 PR Interval:  168 QRS Duration: 84 QT Interval:  386 QTC Calculation: 410 R Axis:   55 Text Interpretation:  Normal sinus rhythm Normal ECG Confirmed by Nomie Buchberger   MD, Berlie Hatchel ([id number]) on 03/13/2015 11:19:49 PM      MDM   Final diagnoses:  None      Patient presents with complaints of chest discomfort after mowing the grass this evening that was similar to his symptoms he experienced when he had stents placed in 2010. His pain was relieved with nitroglycerin in route. Workup reveals an unchanged EKG and negative initial troponin. He has remained pain-free while in the emergency department. I've spoken with Dr. Algie Coffer who will see the patient in the emergency department and admit him to the hospital.   I personally performed the services described in this documentation, which was scribed in my presence. The recorded information has been reviewed and is accurate.      Mark Lyons, MD 03/14/15 [phone]

## 2015-03-13 NOTE — ED Notes (Signed)
Pt reports onset 9:50p epigastric chest pain, took Ntg x 1 with little relief. No other symptoms.

## 2015-03-14 ENCOUNTER — Inpatient Hospital Stay (HOSPITAL_COMMUNITY): Payer: BLUE CROSS/BLUE SHIELD

## 2015-03-14 DIAGNOSIS — I10 Essential (primary) hypertension: Secondary | ICD-10-CM | POA: Diagnosis not present

## 2015-03-14 DIAGNOSIS — R7309 Other abnormal glucose: Secondary | ICD-10-CM | POA: Diagnosis not present

## 2015-03-14 DIAGNOSIS — I25119 Atherosclerotic heart disease of native coronary artery with unspecified angina pectoris: Secondary | ICD-10-CM | POA: Diagnosis not present

## 2015-03-14 DIAGNOSIS — I249 Acute ischemic heart disease, unspecified: Secondary | ICD-10-CM | POA: Diagnosis present

## 2015-03-14 DIAGNOSIS — E780 Pure hypercholesterolemia: Secondary | ICD-10-CM | POA: Diagnosis not present

## 2015-03-14 LAB — LIPID PANEL
Cholesterol: 162 mg/dL (ref 0–200)
HDL: 27 mg/dL — ABNORMAL LOW (ref >40)
LDL CALC: 81 mg/dL (ref 0–99)
Total CHOL/HDL Ratio: 6 RATIO
Triglycerides: 270 mg/dL — ABNORMAL HIGH (ref <150)
VLDL: 54 mg/dL — ABNORMAL HIGH (ref 0–40)

## 2015-03-14 LAB — BASIC METABOLIC PANEL
Anion gap: 9 (ref 5–15)
BUN: 14 mg/dL (ref 6–20)
CALCIUM: 9.2 mg/dL (ref 8.9–10.3)
CO2: 25 mmol/L (ref 22–32)
CREATININE: 0.95 mg/dL (ref 0.61–1.24)
Chloride: 105 mmol/L (ref 101–111)
Glucose, Bld: 120 mg/dL — ABNORMAL HIGH (ref 65–99)
Potassium: 4.2 mmol/L (ref 3.5–5.1)
Sodium: 139 mmol/L (ref 135–145)

## 2015-03-14 LAB — TROPONIN I: Troponin I: <0.03 ng/mL (ref <0.031)

## 2015-03-14 LAB — CBC
HCT: 40.9 % (ref 39.0–52.0)
HEMOGLOBIN: 14 g/dL (ref 13.0–17.0)
MCH: 29.7 pg (ref 26.0–34.0)
MCHC: 34.2 g/dL (ref 30.0–36.0)
MCV: 86.8 fL (ref 78.0–100.0)
PLATELETS: 196 10*3/uL (ref 150–400)
RBC: 4.71 MIL/uL (ref 4.22–5.81)
RDW: 13.8 % (ref 11.5–15.5)
WBC: 6.1 10*3/uL (ref 4.0–10.5)

## 2015-03-14 LAB — HEPARIN LEVEL (UNFRACTIONATED): Heparin Unfractionated: 0.12 IU/mL — ABNORMAL LOW (ref 0.30–0.70)

## 2015-03-14 MED ORDER — METOPROLOL SUCCINATE ER 25 MG PO TB24
25.0000 mg | ORAL_TABLET | Freq: Every day | ORAL | Status: DC
  Administered 2015-03-14: 25 mg via ORAL
  Filled 2015-03-14: qty 1

## 2015-03-14 MED ORDER — SODIUM CHLORIDE 0.9 % IJ SOLN: 3.0000 mL | INTRAMUSCULAR | Status: DC | PRN

## 2015-03-14 MED ORDER — REGADENOSON 0.4 MG/5ML IV SOLN
0.4000 mg | Freq: Once | INTRAVENOUS | Status: AC
  Administered 2015-03-14: 0.4 mg via INTRAVENOUS
  Filled 2015-03-14: qty 5

## 2015-03-14 MED ORDER — NITROGLYCERIN 0.4 MG SL SUBL: 0.4000 mg | SUBLINGUAL_TABLET | SUBLINGUAL | Status: AC | PRN

## 2015-03-14 MED ORDER — TECHNETIUM TC 99M SESTAMIBI GENERIC - CARDIOLITE
30.0000 | Freq: Once | INTRAVENOUS | Status: AC | PRN
  Administered 2015-03-14: 30 via INTRAVENOUS

## 2015-03-14 MED ORDER — ASPIRIN EC 81 MG PO TBEC: 81.0000 mg | DELAYED_RELEASE_TABLET | Freq: Every day | ORAL | Status: DC

## 2015-03-14 MED ORDER — HEPARIN BOLUS VIA INFUSION
4000.0000 [IU] | Freq: Once | INTRAVENOUS | Status: AC
  Administered 2015-03-14: 4000 [IU] via INTRAVENOUS
  Filled 2015-03-14: qty 4000

## 2015-03-14 MED ORDER — ASPIRIN 300 MG RE SUPP
300.0000 mg | RECTAL | Status: AC
  Filled 2015-03-14: qty 1

## 2015-03-14 MED ORDER — HEPARIN BOLUS VIA INFUSION
2800.0000 [IU] | Freq: Once | INTRAVENOUS | Status: DC
  Filled 2015-03-14: qty 2800

## 2015-03-14 MED ORDER — ESOMEPRAZOLE MAGNESIUM 40 MG PO CPDR: 40.0000 mg | DELAYED_RELEASE_CAPSULE | Freq: Every day | ORAL | Status: DC

## 2015-03-14 MED ORDER — CLOPIDOGREL BISULFATE 75 MG PO TABS
75.0000 mg | ORAL_TABLET | Freq: Every day | ORAL | Status: DC
  Administered 2015-03-14: 75 mg via ORAL
  Filled 2015-03-14: qty 1

## 2015-03-14 MED ORDER — ASPIRIN 81 MG PO CHEW
324.0000 mg | CHEWABLE_TABLET | ORAL | Status: AC
  Administered 2015-03-14: 324 mg via ORAL
  Filled 2015-03-14: qty 4

## 2015-03-14 MED ORDER — SODIUM CHLORIDE 0.9 % IJ SOLN
3.0000 mL | Freq: Two times a day (BID) | INTRAMUSCULAR | Status: DC
  Administered 2015-03-14 (×2): 3 mL via INTRAVENOUS

## 2015-03-14 MED ORDER — LISINOPRIL 5 MG PO TABS
5.0000 mg | ORAL_TABLET | Freq: Every day | ORAL | Status: DC
  Administered 2015-03-14: 5 mg via ORAL
  Filled 2015-03-14: qty 1

## 2015-03-14 MED ORDER — ACETAMINOPHEN 325 MG PO TABS
650.0000 mg | ORAL_TABLET | ORAL | Status: DC | PRN
Start: 2015-03-14 — End: 2015-03-14

## 2015-03-14 MED ORDER — TECHNETIUM TC 99M SESTAMIBI GENERIC - CARDIOLITE
10.0000 | Freq: Once | INTRAVENOUS | Status: AC | PRN
  Administered 2015-03-14: 10 via INTRAVENOUS

## 2015-03-14 MED ORDER — REGADENOSON 0.4 MG/5ML IV SOLN
INTRAVENOUS | Status: AC
  Administered 2015-03-14: 0.4 mg via INTRAVENOUS
  Filled 2015-03-14: qty 5

## 2015-03-14 MED ORDER — LORATADINE 10 MG PO TABS
10.0000 mg | ORAL_TABLET | Freq: Every day | ORAL | Status: DC
  Administered 2015-03-14: 10 mg via ORAL
  Filled 2015-03-14: qty 1

## 2015-03-14 MED ORDER — ASPIRIN 81 MG PO TBEC: 81.0000 mg | DELAYED_RELEASE_TABLET | Freq: Every day | ORAL | Status: DC

## 2015-03-14 MED ORDER — NITROGLYCERIN 0.4 MG SL SUBL: 0.4000 mg | SUBLINGUAL_TABLET | SUBLINGUAL | Status: DC | PRN

## 2015-03-14 MED ORDER — SODIUM CHLORIDE 0.9 % IV SOLN: 250.0000 mL | INTRAVENOUS | Status: DC | PRN

## 2015-03-14 MED ORDER — HEPARIN (PORCINE) IN NACL 100-0.45 UNIT/ML-% IJ SOLN
1500.0000 [IU]/h | INTRAMUSCULAR | Status: DC
  Administered 2015-03-14: 1200 [IU]/h via INTRAVENOUS
  Filled 2015-03-14 (×3): qty 250

## 2015-03-14 MED ORDER — ATORVASTATIN CALCIUM 40 MG PO TABS
40.0000 mg | ORAL_TABLET | Freq: Every day | ORAL | Status: DC
  Administered 2015-03-14: 40 mg via ORAL
  Filled 2015-03-14: qty 1

## 2015-03-14 MED ORDER — ONDANSETRON HCL 4 MG/2ML IJ SOLN: 4.0000 mg | Freq: Four times a day (QID) | INTRAMUSCULAR | Status: DC | PRN

## 2015-03-14 MED ORDER — ALPRAZOLAM 0.25 MG PO TABS: 0.2500 mg | ORAL_TABLET | Freq: Two times a day (BID) | ORAL | Status: DC | PRN

## 2015-03-14 MED ORDER — HYDROCODONE-ACETAMINOPHEN 5-325 MG PO TABS: 1.0000 | ORAL_TABLET | ORAL | Status: DC | PRN

## 2015-03-14 NOTE — Progress Notes (Signed)
Subjective:  Doing well.  Denies any further episodes of chest pain.  Cardiac enzymes were not drawn yet.  First set of enzymes were negative.  EKG no acute ischemic changes  Objective:  Vital Signs in the last 24 hours: Temp:  [98.2 F (36.8 C)-98.6 F (37 C)] 98.2 F (36.8 C) (08/08 1351) Pulse Rate:  [48-79] 71 (08/08 1351) Resp:  [16-21] 16 (08/08 1351) BP: (91-141)/(55-92) 123/66 mmHg (08/08 1351) SpO2:  [94 %-99 %] 97 % (08/08 1351) Weight:  [98.975 kg (218 lb 3.2 oz)-99.701 kg (219 lb 12.8 oz)] 98.975 kg (218 lb 3.2 oz) (08/08 0252)  Intake/Output from previous day:   Intake/Output from this shift: Total I/O In: 243 [P.O.:240; I.V.:3] Out: -   Physical Exam: Neck: no adenopathy, no carotid bruit, no JVD and supple, symmetrical, trachea midline Lungs: clear to auscultation bilaterally Heart: regular rate and rhythm, S1, S2 normal and soft systolic murmur noted Abdomen: soft, non-tender; bowel sounds normal; no masses,  no organomegaly  Lab Results:  Recent Labs  03/13/15 2209 03/14/15 0359  WBC 6.9 6.1  HGB 14.7 14.0  PLT 219 196    Recent Labs  03/13/15 2209 03/14/15 0359  NA 139 139  K 4.3 4.2  CL 107 105  CO2 24 25  GLUCOSE 117* 120*  BUN 16 14  CREATININE 0.90 0.95   No results for input(s): TROPONINI in the last 72 hours.  Invalid input(s): CK, MB Hepatic Function Panel No results for input(s): PROT, ALBUMIN, AST, ALT, ALKPHOS, BILITOT, BILIDIR, IBILI in the last 72 hours.  Recent Labs  03/14/15 0359  CHOL 162   No results for input(s): PROTIME in the last 72 hours.  Imaging: Imaging results have been reviewed and Dg Chest 2 View  03/13/2015   CLINICAL DATA:  Shortness of breath with substernal chest pain  EXAM: CHEST  2 VIEW  COMPARISON:  Dec 19, 2010  FINDINGS: There is atelectasis in the left base. Lungs elsewhere clear. Heart size and pulmonary vascularity are normal. No adenopathy. No bone lesions.  IMPRESSION: Left base atelectasis.   Lungs elsewhere clear.   Electronically Signed   By: Bretta Bang III M.D.   On: 03/13/2015 23:44   Nm Myocar Multi W/spect W/wall Motion / Ef  03/14/2015   CLINICAL DATA:  Chest pain  EXAM: MYOCARDIAL IMAGING WITH SPECT (REST AND PHARMACOLOGIC-STRESS)  GATED LEFT VENTRICULAR WALL MOTION STUDY  LEFT VENTRICULAR EJECTION FRACTION  TECHNIQUE: Standard myocardial SPECT imaging was performed after resting intravenous injection of 10 mCi Tc-72m sestamibi. Subsequently, intravenous infusion of Lexiscan was performed under the supervision of the Cardiology staff. At peak effect of the drug, 30 mCi Tc-62m sestamibi was injected intravenously and standard myocardial SPECT imaging was performed. Quantitative gated imaging was also performed to evaluate left ventricular wall motion, and estimate left ventricular ejection fraction.  COMPARISON:  None.  FINDINGS: Perfusion: Allowing for attenuation artifact at the apex, there are no perfusion defects.  Wall Motion: Normal left ventricular wall motion. No left ventricular dilation.  Left Ventricular Ejection Fraction: 62 %  End diastolic volume 110 ml  End systolic volume 42 ml  IMPRESSION: 1. No reversible ischemia or infarction.  2. Normal left ventricular wall motion.  3. Left ventricular ejection fraction 62%  4. Low-risk stress test findings*.  *2012 Appropriate Use Criteria for Coronary Revascularization Focused Update: J Am Coll Cardiol. 2012;59(9):857-881. http://content.dementiazones.com.aspx?articleid=1201161   Electronically Signed   By: Jolaine Click M.D.   On: 03/14/2015 13:47  Cardiac Studies:  Assessment/Plan:  Stable angina. Coronary artery disease, history of MI in the past, status post PCI to RCA and left circumflex in the past. Hypertension. Hypercholesterolemia. Prediabetic Plan Continue present management.    LOS: 0 days    Rinaldo Cloud 03/14/2015, 3:01 PM

## 2015-03-14 NOTE — H&P (Signed)
Referring Physician:  [name] is an 54 y.o. male.                       Chief Complaint: Chest pain  HPI: 54 year old male with known CAD and stents in 2008 and 2010 presents with recurrent, substernal chest pain radiating to left shoulder and relieved by SL NTG. No nausea, vomiting or diarrhea. No fever or cough. EKG-NSR.  Past Medical History  Diagnosis Date  . Coronary artery disease   . Hypertension       Past Surgical History  Procedure Laterality Date  . Coronary stent placement      4 stents    History reviewed. No pertinent family history. Social History:  reports that he has never smoked. He does not have any smokeless tobacco history on file. He reports that he drinks alcohol. He reports that he does not use illicit drugs.  Allergies: No Known Allergies   (Not in a hospital admission)  Results for orders placed or performed during the hospital encounter of 03/13/15 (from the past 48 hour(s))  Basic metabolic panel     Status: Abnormal   Collection Time: 03/13/15 10:09 PM  Result Value Ref Range   Sodium 139 135 - 145 mmol/L   Potassium 4.3 3.5 - 5.1 mmol/L   Chloride 107 101 - 111 mmol/L   CO2 24 22 - 32 mmol/L   Glucose, Bld 117 (H) 65 - 99 mg/dL   BUN 16 6 - 20 mg/dL   Creatinine, Ser 0.90 0.61 - 1.24 mg/dL   Calcium 9.6 8.9 - 10.3 mg/dL   GFR calc non Af Amer >60 >60 mL/min   GFR calc Af Amer >60 >60 mL/min    Comment: (NOTE) The eGFR has been calculated using the CKD EPI equation. This calculation has not been validated in all clinical situations. eGFR's persistently <60 mL/min signify possible Chronic Kidney Disease.    Anion gap 8 5 - 15  CBC     Status: None   Collection Time: 03/13/15 10:09 PM  Result Value Ref Range   WBC 6.9 4.0 - 10.5 K/uL   RBC 4.90 4.22 - 5.81 MIL/uL   Hemoglobin 14.7 13.0 - 17.0 g/dL   HCT 42.3 39.0 - 52.0 %   MCV 86.3 78.0 - 100.0 fL   MCH 30.0 26.0 - 34.0 pg   MCHC 34.8 30.0 - 36.0 g/dL   RDW 13.5 11.5 - 15.5  %   Platelets 219 150 - 400 K/uL  I-stat troponin, ED     Status: None   Collection Time: 03/13/15 10:23 PM  Result Value Ref Range   Troponin i, poc 0.01 0.00 - 0.08 ng/mL   Comment 3            Comment: Due to the release kinetics of cTnI, a negative result within the first hours of the onset of symptoms does not rule out myocardial infarction with certainty. If myocardial infarction is still suspected, repeat the test at appropriate intervals.    Dg Chest 2 View  03/13/2015   CLINICAL DATA:  Shortness of breath with substernal chest pain  EXAM: CHEST  2 VIEW  COMPARISON:  Dec 19, 2010  FINDINGS: There is atelectasis in the left base. Lungs elsewhere clear. Heart size and pulmonary vascularity are normal. No adenopathy. No bone lesions.  IMPRESSION: Left base atelectasis.  Lungs elsewhere clear.   Electronically Signed   By: Lowella Grip III M.D.  On: 03/13/2015 23:44    Review Of Systems Constitutional: Negative for diaphoresis.  Respiratory: Positive for shortness of breath.  Cardiovascular: Positive for chest pain.  Gastrointestinal: Negative for nausea and vomiting.  Neurological: Positive for headaches.  All other systems reviewed and are negative.  Blood pressure 138/79, pulse 67, temperature 98.6 F (37 C), temperature source Oral, resp. rate 20, height $RemoveBe'5\' 10"'UcsmshlLq$  (1.778 m), weight 99.701 kg (219 lb 12.8 oz), SpO2 95 %. Physical Exam  Constitutional: He is oriented to person, place, and time. He appears well-developed and well-nourished. No distress.  HENT: Normocephalic and atraumatic, Conjunctivae and EOM are normal. Tongue is pink, midline and dry. Neck: Neck supple. No tracheal deviation present.  Cardiovascular: Normal rate, S1 and S2 normal. II/VI systolic murmur..  Pulmonary/Chest: Effort normal. No respiratory distress. Clear to auscultation. Musculoskeletal: Normal range of motion. No calf tenderness. Neurological: He is alert and oriented to person, place,  and time. Moves all 4 extremities. Skin: Skin is warm and dry.  Psychiatric: He has a normal mood and affect. His behavior is normal.  Nursing note and vitals reviewed.  Assessment/Plan Acute Coronary syndrome Hypertension CAD S/P Stent  Admit. Cycle cardiac enzymes. Nuclear stress test in AM.  Birdie Riddle, MD  03/14/2015, 12:22 AM

## 2015-03-14 NOTE — Discharge Summary (Signed)
Discharge summary dictated on 03/14/2015 dictation number is [id number] discharge patient discharge patient

## 2015-03-14 NOTE — Discharge Instructions (Signed)
Angina Pectoris  Angina pectoris, often just called angina, is extreme discomfort in your chest, neck, or arm caused by a lack of blood in the middle and thickest layer of your heart wall (myocardium). It may feel like tightness or heavy pressure. It may feel like a crushing or squeezing pain. Some people say it feels like gas or indigestion. It may go down your shoulders, back, and arms. Some people may have symptoms other than pain. These symptoms include fatigue, shortness of breath, cold sweats, or nausea. There are four different types of angina:  · Stable angina--Stable angina usually occurs in episodes of predictable frequency and duration. It usually is brought on by physical activity, emotional stress, or excitement. These are all times when the myocardium needs more oxygen. Stable angina usually lasts a few minutes and often is relieved by taking a medicine that can be taken under your tongue (sublingually). The medicine is called nitroglycerin. Stable angina is caused by a buildup of plaque inside the arteries, which restricts blood flow to the heart muscle (atherosclerosis).  · Unstable angina--Unstable angina can occur even when your body experiences little or no physical exertion. It can occur during sleep. It can also occur at rest. It can suddenly increase in severity or frequency. It might not be relieved by sublingual nitroglycerin. It can last up to 30 minutes. The most common cause of unstable angina is a blood clot that has developed on the top of plaque buildup inside a coronary artery. It can lead to a heart attack if the blood clot completely blocks the artery.  · Microvascular angina--This type of angina is caused by a disorder of tiny blood vessels called arterioles. Microvascular angina is more common in women. The pain may be more severe and last longer than other types of angina pectoris.  · Prinzmetal or variant angina--This type of angina pectoris usually occurs when your body  experiences little or no physical exertion. It especially occurs in the early morning hours. It is caused by a spasm of your coronary artery.  HOME CARE INSTRUCTIONS   · Only take over-the-counter and prescription medicines as directed by your health care provider.  · Stay active or increase your exercise as directed by your health care provider.  · Limit strenuous activity as directed by your health care provider.  · Limit heavy lifting as directed by your health care provider.  · Maintain a healthy weight.  · Learn about and eat heart-healthy foods.  · Do not use any tobacco products including cigarettes, chewing tobacco or electronic cigarettes.  SEEK IMMEDIATE MEDICAL CARE IF:   You experience the following symptoms:  · Chest, neck, deep shoulder, or arm pain or discomfort that lasts more than a few minutes.  · Chest, neck, deep shoulder, or arm pain or discomfort that goes away and comes back, repeatedly.  · Heavy sweating with discomfort, without a noticeable cause.  · Shortness of breath or difficulty breathing.  · Angina that does not get better after a few minutes of rest or after taking sublingual nitroglycerin.  These can all be symptoms of a heart attack, which is a medical emergency! Get medical help at once. Call your local emergency service (911 in [place] immediately. Do not  drive yourself to the hospital and do not  wait to for your symptoms to go away.  MAKE SURE YOU:  · Understand these instructions.  · Will watch your condition.  · Will get help right away if you are not   doing well or get worse.  Document Released: 07/23/2005 Document Revised: 07/28/2013 Document Reviewed: 11/24/2013  [employer]® Patient Information ©2015 [employer]. This information is not intended to replace advice given to you by your health care provider. Make sure you discuss any questions you have with your health care provider.

## 2015-03-14 NOTE — Progress Notes (Signed)
ANTICOAGULATION CONSULT NOTE - Initial Consult  Pharmacy Consult for Heparin  Indication: chest pain/ACS  No Known Allergies  Patient Measurements: Height:  (177.8 cm) Weight: 218 lb 3.2 oz (98.975 kg) IBW/kg (Calculated) : 73  Heparin dosing weight = 93.8 kg  Vital Signs: Temp: 98.2 F (36.8 C) (08/08 1351) Temp Source: Oral (08/08 1351) BP: 123/66 mmHg (08/08 1351) Pulse Rate: 71 (08/08 1351)  Labs:  Recent Labs  03/13/15 2209 03/14/15 0359 03/14/15 1530  HGB 14.7 14.0  --   HCT 42.3 40.9  --   PLT 219 196  --   HEPARINUNFRC  --   --  0.12*  CREATININE 0.90 0.95  --     Estimated Creatinine Clearance: 104.9 mL/min (by C-G formula based on Cr of 0.95).   Medical History: Past Medical History  Diagnosis Date  . Coronary artery disease   . Hypertension     Assessment: 54 y/o M with cardiac history here with chest pain, to start heparin per pharmacy, CBC/renal function good, other labs reviewed. Heparin level = 0.12 which is SUBtherapeutic.   Goal of Therapy:  Heparin level 0.3-0.7 units/ml Monitor platelets by anticoagulation protocol: Yes   Plan:  -Heparin 2800 unit bolus -Increase heparin drip to 1500 units/hr -2230 HL -Daily CBC/HL -Monitor for bleeding  Juanita Craver, PharmD, BCPS Clinical Pharmacist [phone]

## 2015-03-14 NOTE — Progress Notes (Signed)
ANTICOAGULATION CONSULT NOTE - Initial Consult  Pharmacy Consult for Heparin  Indication: chest pain/ACS  No Known Allergies  Patient Measurements: Height:  (177.8 cm) Weight: 219 lb 12.8 oz (99.701 kg) IBW/kg (Calculated) : 73  Vital Signs: Temp: 98.6 F (37 C) (08/08 0252) Temp Source: Oral (08/08 0252) BP: 123/76 mmHg (08/08 0252) Pulse Rate: 63 (08/08 0252)  Labs:  Recent Labs  03/13/15 2209  HGB 14.7  HCT 42.3  PLT 219  CREATININE 0.90    Estimated Creatinine Clearance: 111.1 mL/min (by C-G formula based on Cr of 0.9).   Medical History: Past Medical History  Diagnosis Date  . Coronary artery disease   . Hypertension     Assessment: 54 y/o M with cardiac history here with chest pain, to start heparin per pharmacy, CBC/renal function good, other labs reviewed.   Goal of Therapy:  Heparin level 0.3-0.7 units/ml Monitor platelets by anticoagulation protocol: Yes   Plan:  -Heparin 400 units BOLUS -Start heparin drip at 1200 units/hr -1100 HL -Daily CBC/HL -Monitor for bleeding  Abran Duke 03/14/2015,3:01 AM

## 2015-03-14 NOTE — ED Notes (Signed)
MD at bedside. 

## 2015-03-15 NOTE — Discharge Summary (Signed)
NAME:  Mark Melendez NO.:  [id number]  MEDICAL RECORD NO.:  [id number]  LOCATION:  2W05C                        FACILITY:  [facility]  PHYSICIAN:  Eduardo Osier. Sharyn Lull, M.D. DATE OF BIRTH:  January 31, 1961  DATE OF ADMISSION:  03/13/2015 DATE OF DISCHARGE:  03/14/2015                              DISCHARGE SUMMARY   ADMITTING DIAGNOSES: 1. Acute coronary syndrome, rule out myocardial infarction. 2. Coronary artery disease, history of myocardial infarction in the     past, hypertension, hypercholesteremia.  DISCHARGE DIAGNOSES: 1. Stable angina, myocardial infarction ruled out, negative nuclear     stress test. 2. Coronary artery disease, history of myocardial infarction in the     past, status post PCI to RCA and left circumflex in the past. 3. Hypertension. 4. Hypercholesteremia. 5. Prediabetic. 6. Gastroesophageal reflux disease.  DISCHARGE HOME MEDICATION LIST: 1. Aspirin 81 mg 1 tablet daily. 2. Nexium 40 mg 1 capsule daily. 3. Nitrostat 0.4 mg sublingual use as directed. 4. Clopidogrel 75 mg daily. 5. Omega-3 fish oil 1 g daily. 6. Lisinopril 5 mg daily. 7. Metoprolol succinate 25 mg daily. 8. Multivitamin 1 tablet daily. 9. Simvastatin 80 mg daily. 10.The patient has been advised to stop aspirin 325 and reduce it to     81 mg daily as above.  The patient also advised to stop Naprosyn.  DIET:  Low salt low cholesterol 1800 calories ADA diet.  The patient has been advised to avoid sweets.  FOLLOWUP:  Follow up with me in 1 week.  CONDITION AT DISCHARGE:  Stable.  BRIEF HISTORY AND HOSPITAL COURSE:  [name] is a 54 year old male with known coronary artery disease, history of MI in the past, status post PTCA stenting to RCA and left circumflex in the past, hypertension, hypercholesteremia, glucose intolerance came to the ER complaining of recurrent substernal chest pain radiating to left shoulder, relieved with sublingual nitro.  No nausea, vomiting, or  diaphoresis.  No fever or cough.  EKG showed normal sinus rhythm with no acute ischemic changes.  PHYSICAL EXAMINATION:  GENERAL:  He was alert, awake, oriented x3. VITAL SIGNS:  Blood pressure was 138/79, pulse 67, he was afebrile. HEENT:  Conjunctivae pink. NECK:  Supple.  No JVD.  No bruit. LUNGS:  Clear to auscultation without rhonchi or rales. CARDIOVASCULAR:  S1, S2 was normal.  There was 2/6 systolic murmur. ABDOMEN:  Soft.  Bowel sounds were present.  Nontender. EXTREMITIES:  There was no clubbing, cyanosis, or edema. NEUROLOGIC:  Grossly intact.  LABORATORY DATA:  Sodium was 139, potassium 4.3, BUN 16, creatinine 0.90.  His blood sugar was 117, repeat fasting blood sugar was 120.  Two sets of troponin-I were normal.  Cholesterol was 162, LDL 81, HDL was low at 27, triglycerides were elevated at 270.  Hemoglobin was 14.7, hematocrit 42.3, white count of 6.9.  Chest x-ray showed left basilar atelectasis, otherwise clear.  EKG showed normal sinus rhythm with no acute ischemic changes.  Nuclear stress test showed no reversible ischemia or infarction.  Normal left ventricular wall motion, EF of 62%.  BRIEF HOSPITAL COURSE:  The patient was admitted to telemetry unit.  MI was ruled out by serial  enzymes and EKG.  The patient did not have any episodes of chest pain during the hospital stay.  The patient subsequently underwent nuclear stress test as above which showed no evidence of reversible ischemia.  The patient is ambulating in room and hallway without any problems.  The patient will be discharged home on above medications and will be followed up in my office in 1 week.  If he continues to have chest pain, we will get GI workup as outpatient.     Eduardo Osier. Sharyn Lull, M.D.     [facility]/MEDQ  D:  03/14/2015  T:  03/15/2015  Job:  [id number]

## 2016-05-02 ENCOUNTER — Other Ambulatory Visit: Payer: Self-pay | Admitting: Family

## 2016-05-02 ENCOUNTER — Ambulatory Visit
Admission: RE | Admit: 2016-05-02 | Discharge: 2016-05-02 | Disposition: A | Discharge status: Home / Self Care | Payer: 59 | Source: Ambulatory Visit | Attending: Family | Admitting: Family

## 2016-05-02 DIAGNOSIS — M25572 Pain in left ankle and joints of left foot: Secondary | ICD-10-CM

## 2016-05-02 DIAGNOSIS — M79672 Pain in left foot: Secondary | ICD-10-CM

## 2016-05-02 IMAGING — CR DG FOOT COMPLETE 3+V*L*
3 series · 3 of 3 positions shown · non-contrast
Comparison: None.

CLINICAL DATA: Increasing pain in soft tissue swelling across upper
left foot for 2 days. Recent injury. History of previous ankle
surgery over 20 years ago.

EXAM:
LEFT FOOT - COMPLETE 3+ VIEW

[x foot lat left]
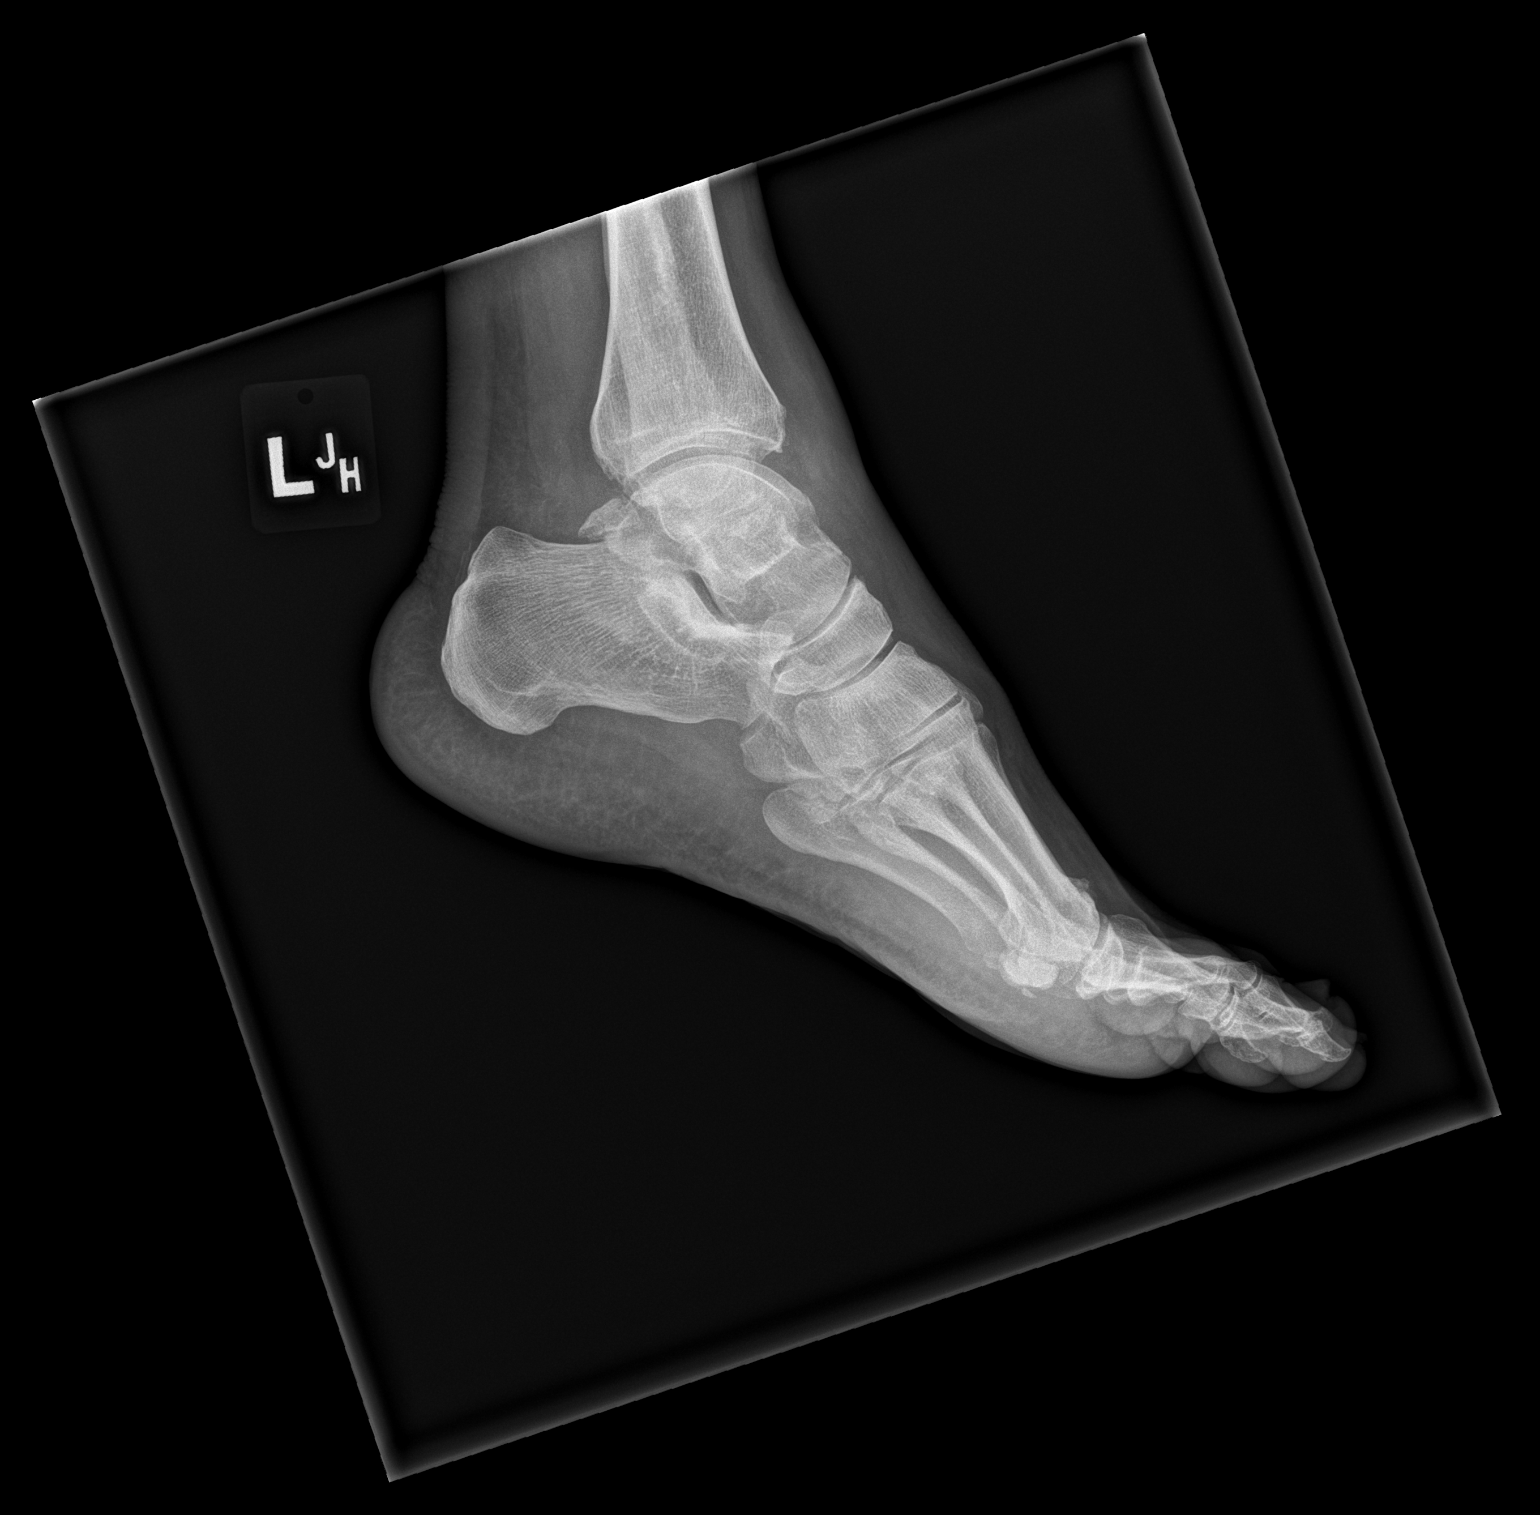

[x foot ap left]
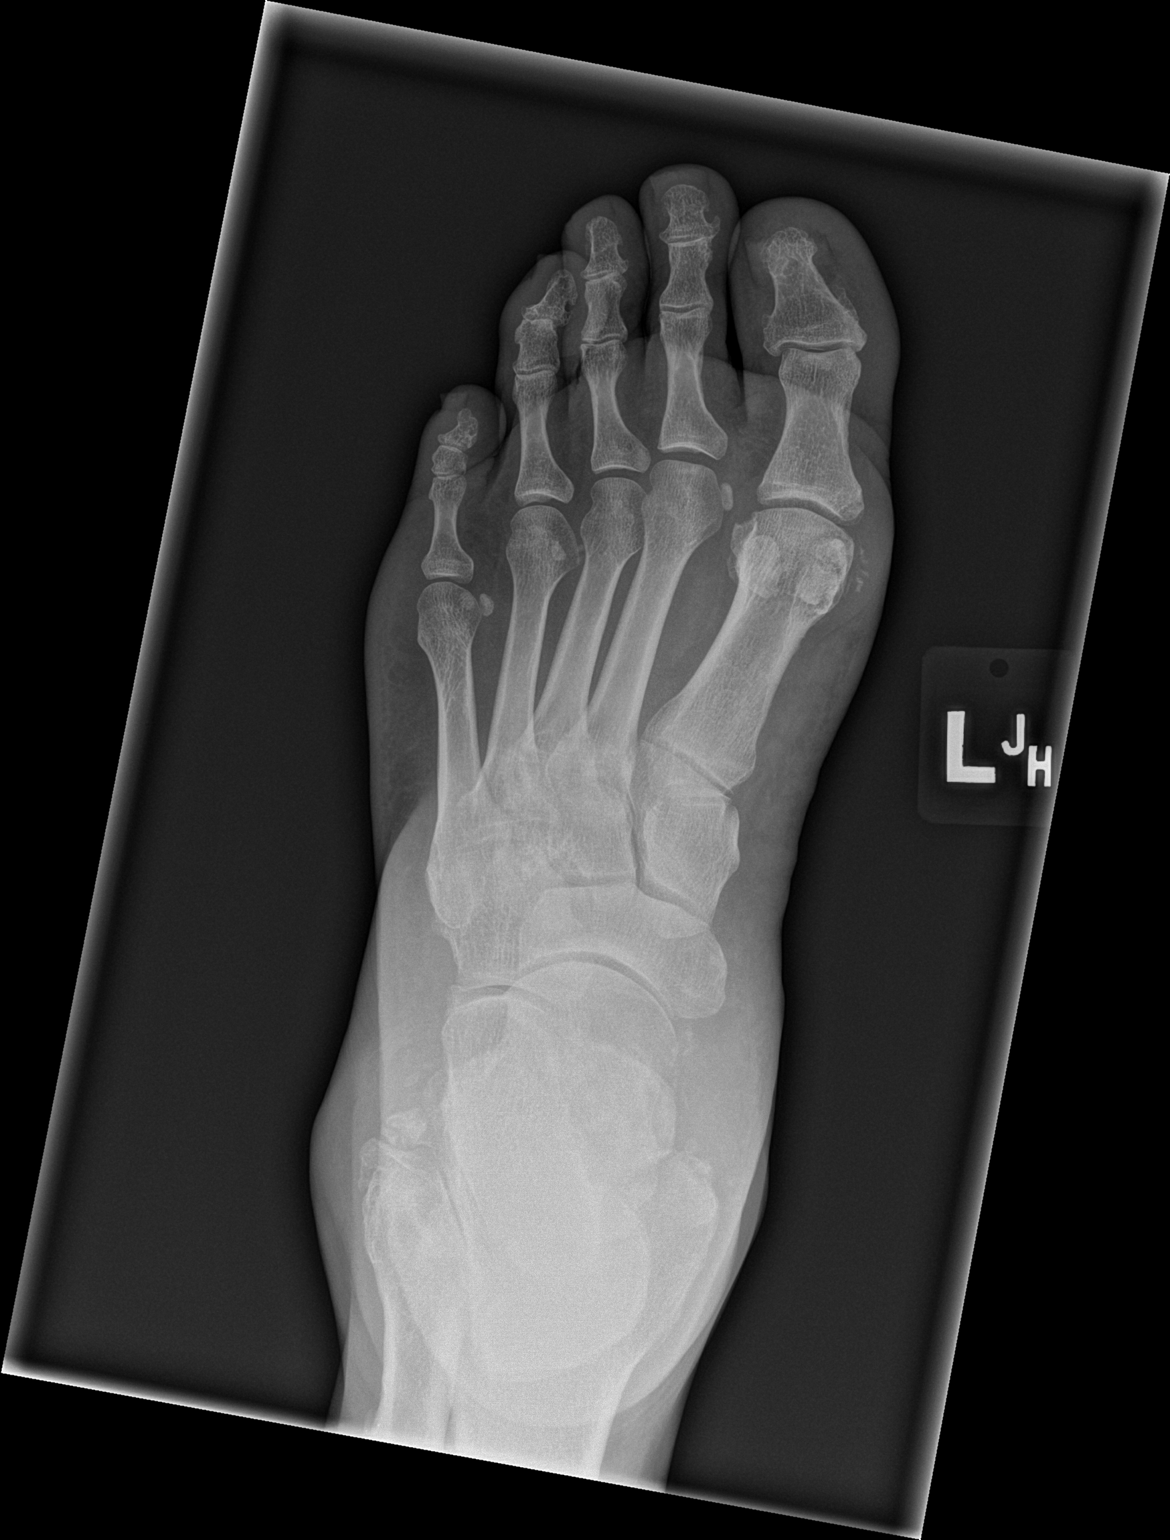

[x foot obl left]
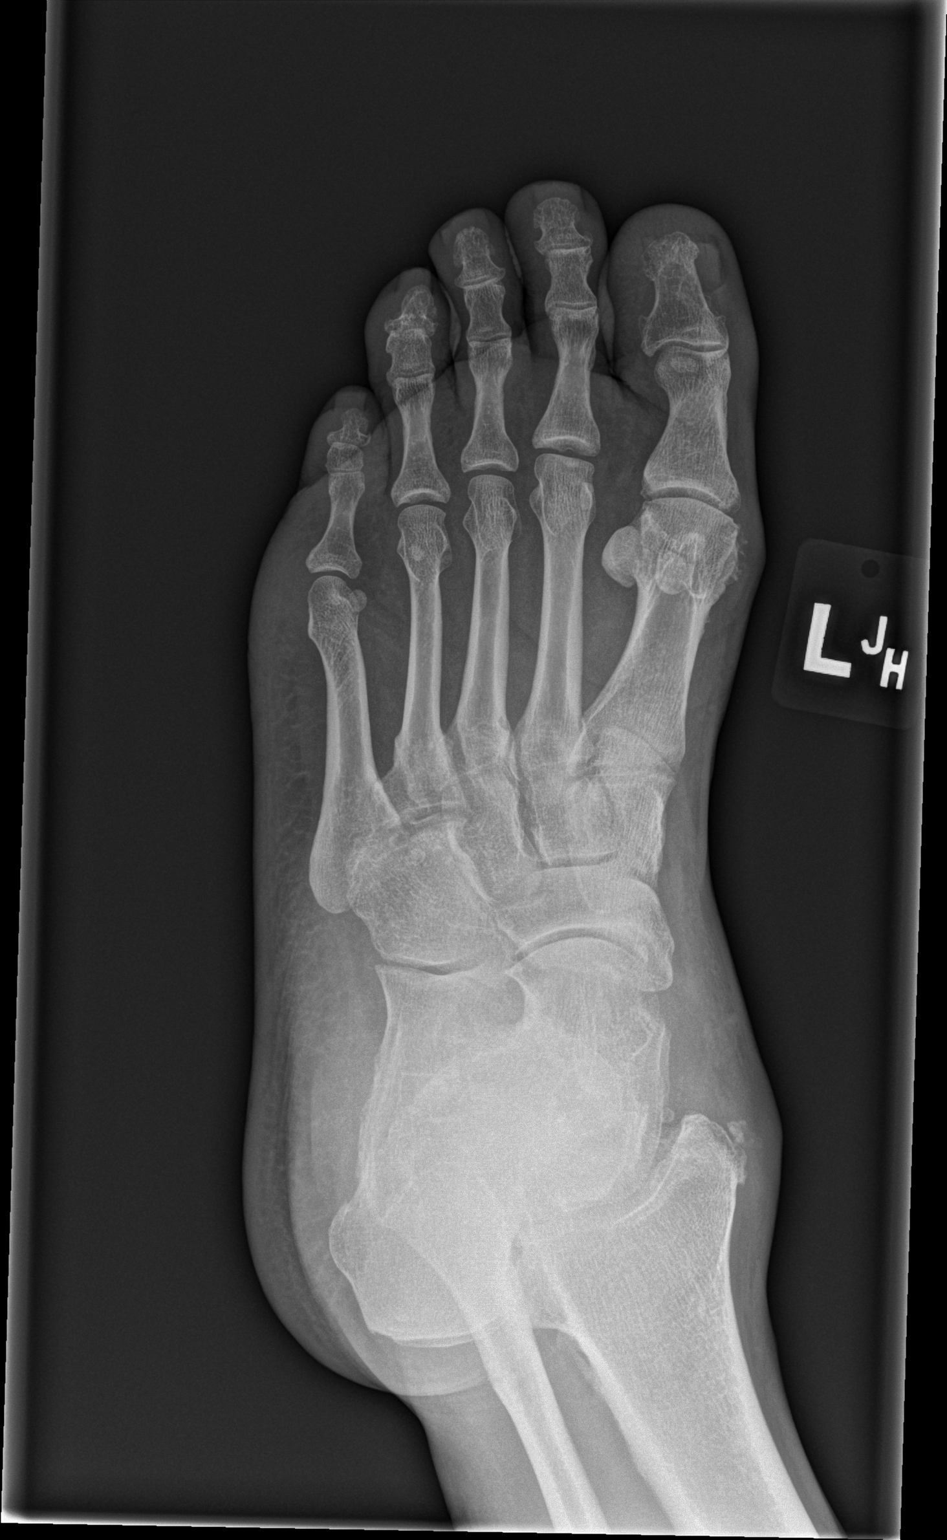

[3 of 3 positions shown; findings below may reference images not displayed]

FINDINGS: Osseous alignment is normal. Overall bone mineralization is normal.
No fracture line or displaced fracture fragment identified. No acute
or suspicious osseous lesion. No evidence of significant
degenerative change. Adjacent soft tissues are unremarkable.
IMPRESSION: No acute findings.  No osseous fracture or dislocation.

## 2016-05-02 IMAGING — CR DG ANKLE COMPLETE 3+V*L*
3 series · 3 of 3 positions shown · non-contrast
Comparison: Left ankle plain film dated 01/20/2015.

CLINICAL DATA: Increasing pain and soft tissue swelling across
upper left foot for 2 days. Recent injury. History of remote left
ankle surgery over 20 years ago.

EXAM:
LEFT ANKLE COMPLETE - 3+ VIEW

[x ankle ap left]
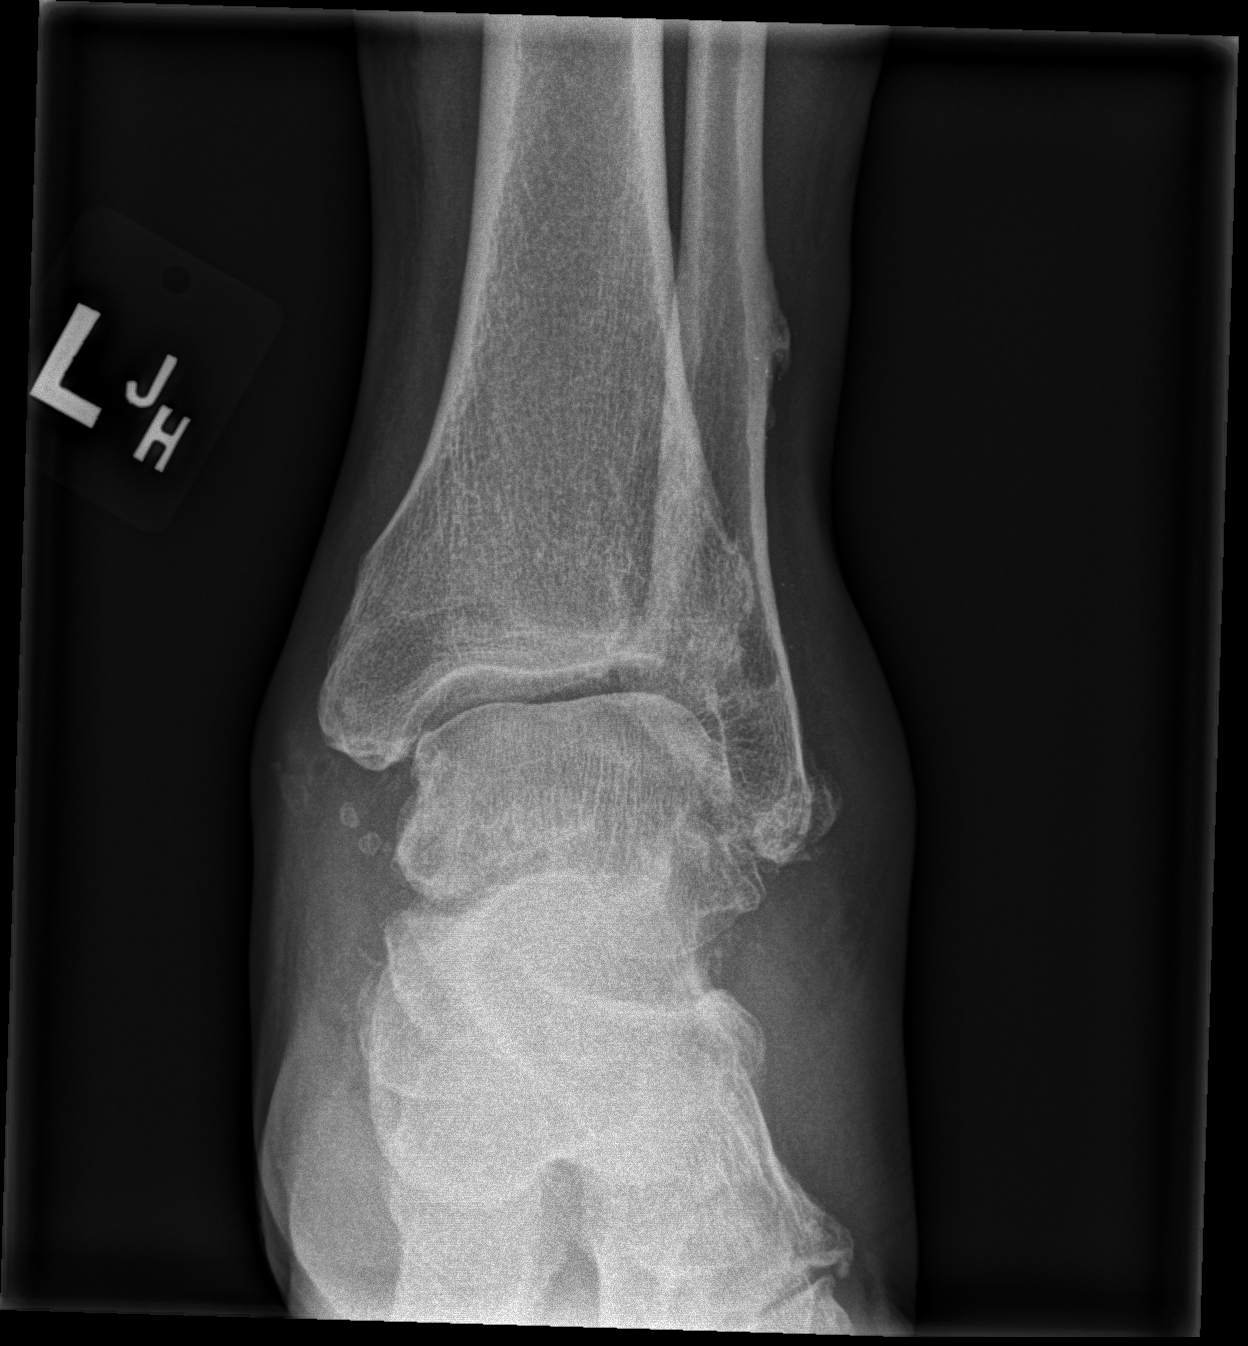

[x ankle obl left]
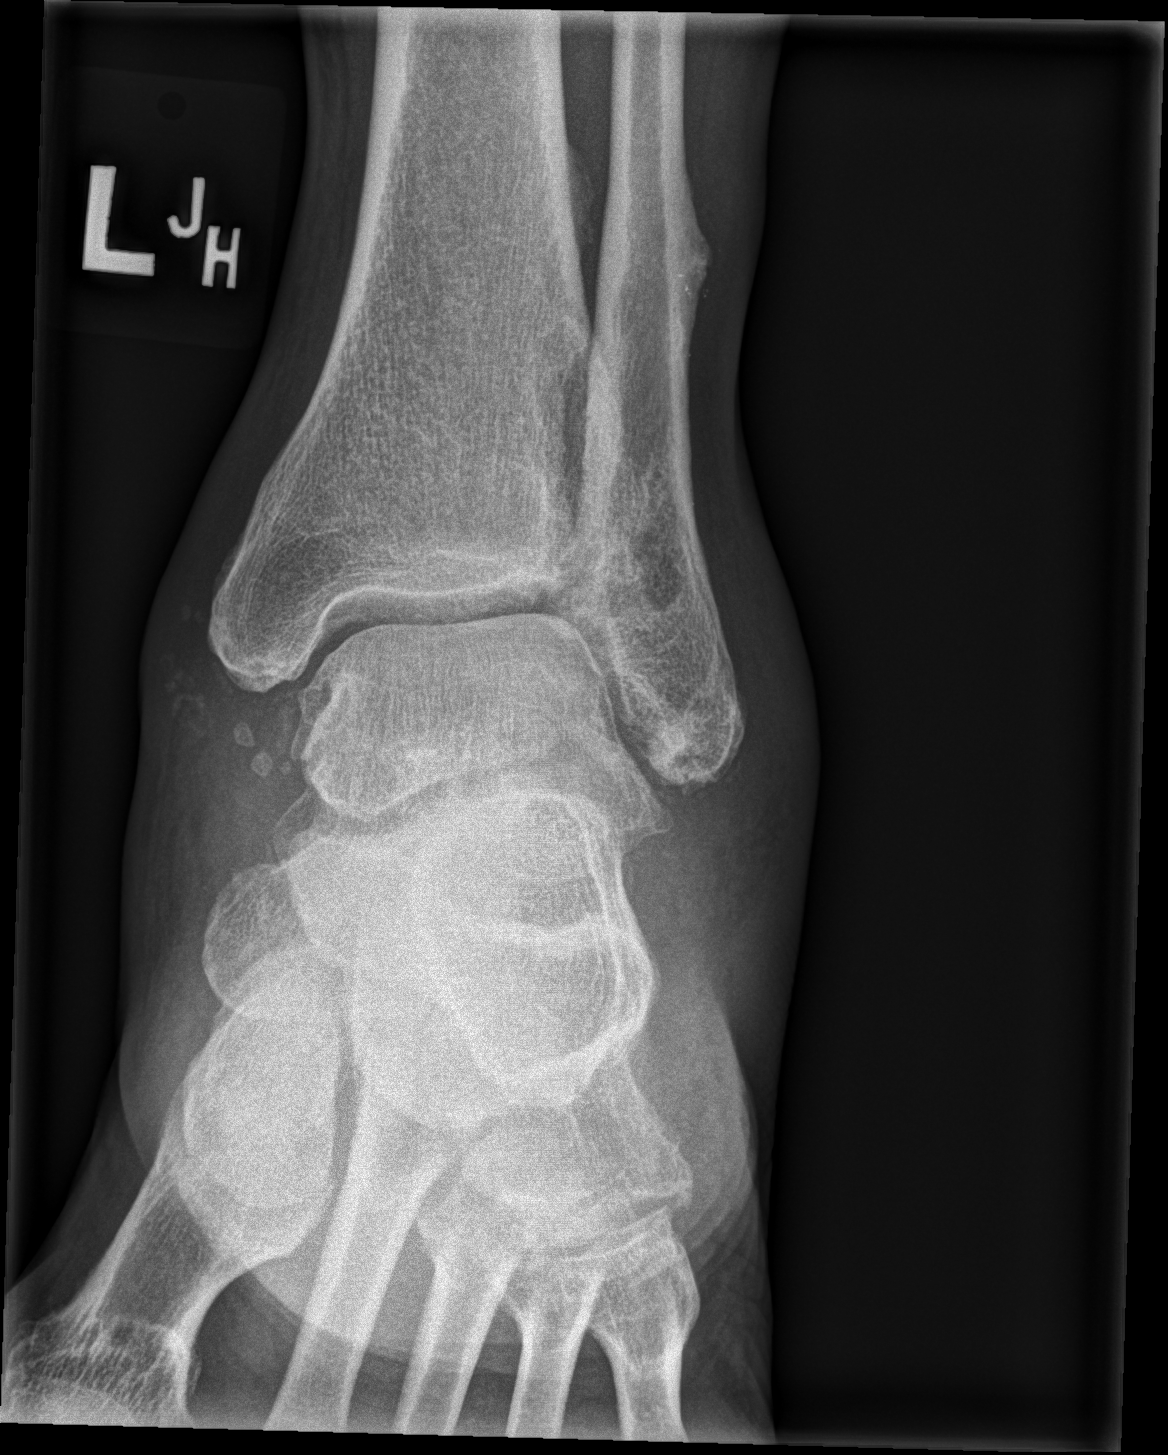

[x ankle lat left]
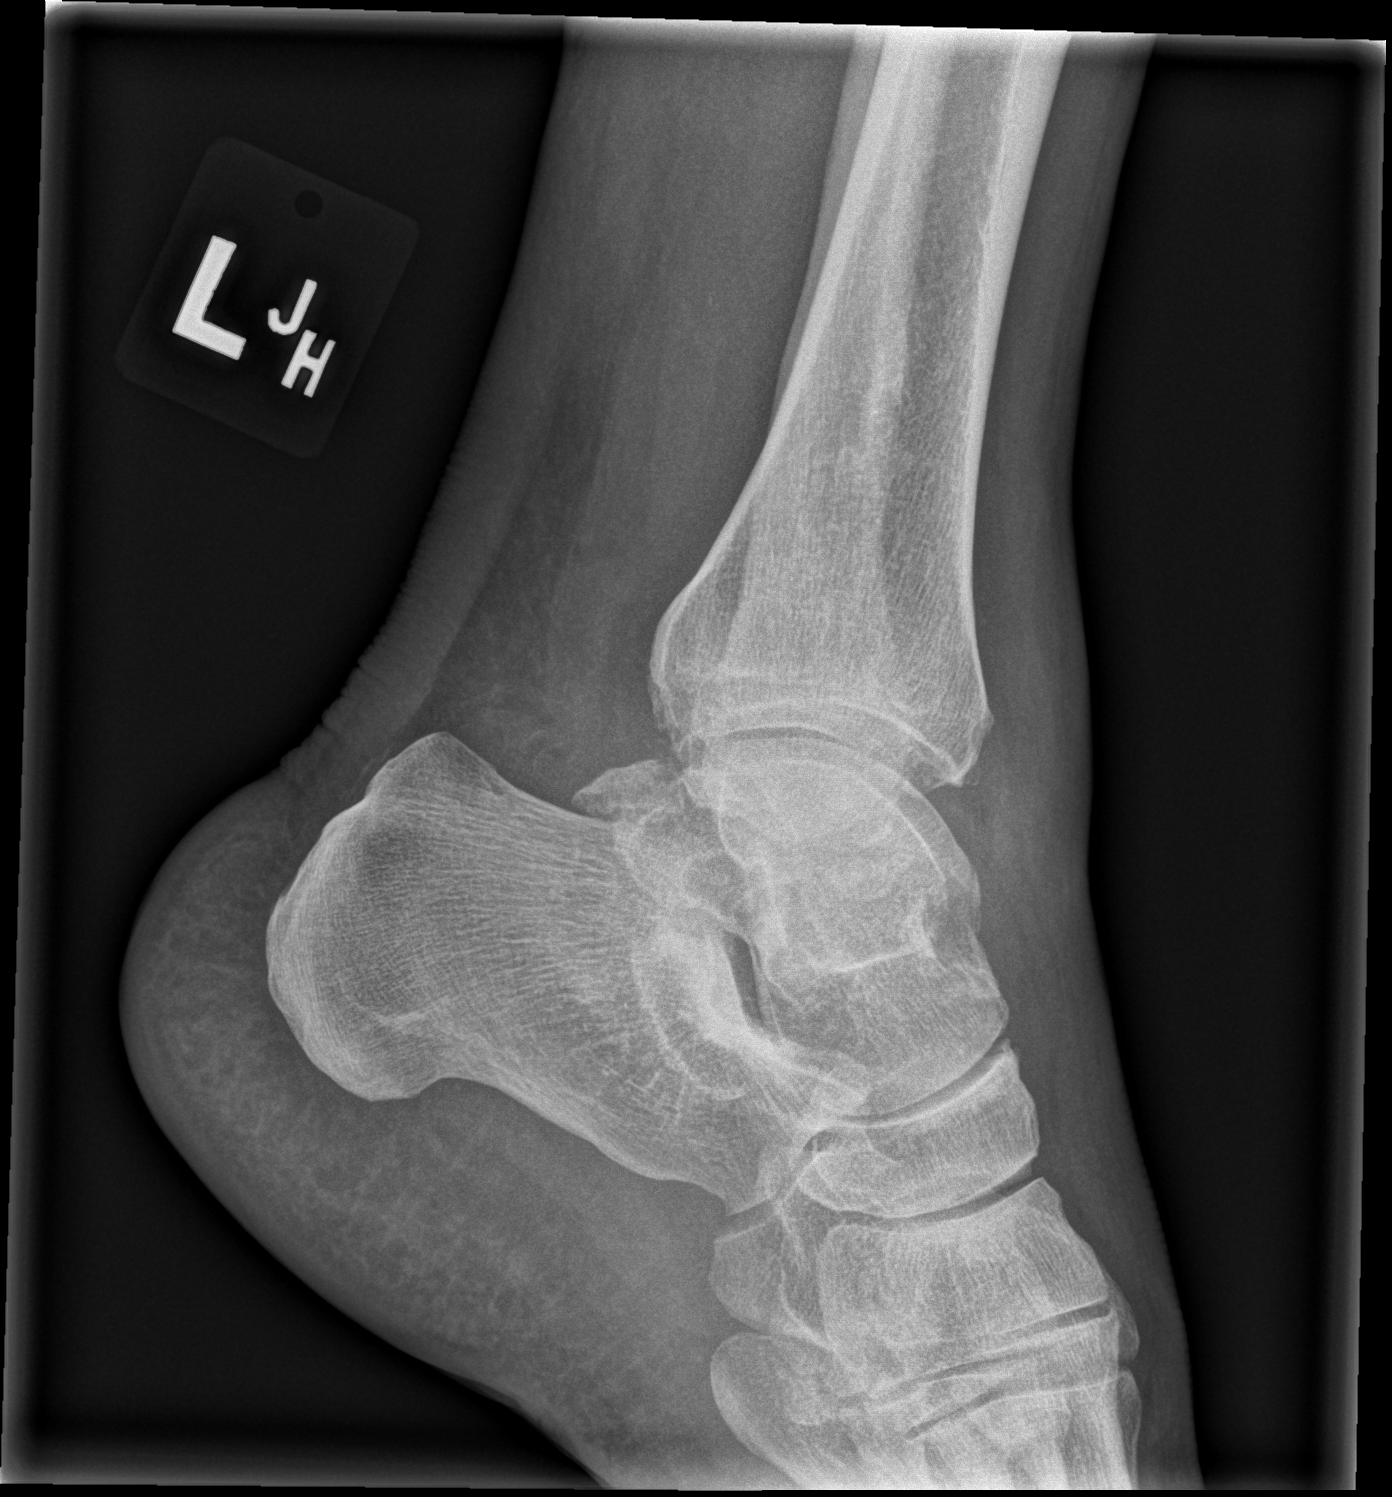

[3 of 3 positions shown; findings below may reference images not displayed]

FINDINGS: As also seen on the earlier exam, there is diffuse soft tissue
swelling. Old posttraumatic changes again noted, with stable small
avulsion fracture fragments. No evidence of acute fracture or
dislocation.
IMPRESSION: Stable plain film examination of the left ankle, with old
posttraumatic changes again noted. No acute abnormality.

## 2018-01-08 ENCOUNTER — Encounter

## 2019-05-01 ENCOUNTER — Telehealth: Payer: 59 | Admitting: Family

## 2019-05-01 DIAGNOSIS — Z20828 Contact with and (suspected) exposure to other viral communicable diseases: Secondary | ICD-10-CM

## 2019-05-01 DIAGNOSIS — Z20822 Contact with and (suspected) exposure to covid-19: Secondary | ICD-10-CM

## 2019-05-01 NOTE — Progress Notes (Signed)
E-Visit for Corona Virus Screening   Since you were exposed to COVID, you can be tested. See below.  Many health care providers can now test patients at their office but not all are.  [facility] has multiple testing sites. For information on our COVID testing locations and hours go to [email or web address]  Please quarantine yourself while awaiting your test results.  We are enrolling you in our [place] for [place] . Daily you will receive a questionnaire within the [place] website. Our COVID 19 response team willl be monitoriing your responses daily.  You can go to one of the  testing sites listed below, while they are opened (see hours). You do not need an order and will stay in your car during the test. You do need to self isolate until your results return and if positive 14 days from when your symptoms started and until you are 3 days symptom free.   Testing Locations (Monday - Friday, 8 a.m. - 3:30 p.m.) . [place]: [facility] at [facility], [address], [place], [place]: [place], [place], [place], [place] (entrance [address])  . [facility]: (Closed each Monday): Testing site relocated to the short stay covered drive at [facility]. (Use the [facility] entrance to [facility] next to [place] is a respiratory illness with symptoms that are similar to the flu. Symptoms are typically mild to moderate, but there have been cases of severe illness and death due to the virus. The following symptoms may appear 2-14 days after exposure: . Fever . Cough . Shortness of breath or difficulty breathing . Chills . Repeated shaking with chills . Muscle pain . Headache . Sore throat . New loss of taste or smell . Fatigue . Congestion or runny nose . Nausea or vomiting . Diarrhea  It is vitally important that if you feel that you  have an infection such as this virus or any other virus that you stay home and away from places where you may spread it to others.  You should self-quarantine for 14 days if you have symptoms that could potentially be coronavirus or have been in close contact a with a person diagnosed with COVID-19 within the last 2 weeks. You should avoid contact with people age 58 and older.   You should wear a mask or cloth face covering over your nose and mouth if you must be around other people or animals, including pets (even at home). Try to stay at least 6 feet away from other people. This will protect the people around you.   You may also take acetaminophen (Tylenol) as needed for fever.   Reduce your risk of any infection by using the same precautions used for avoiding the common cold or flu:  Marland Kitchen Wash your hands often with soap and warm water for at least 20 seconds.  If soap and water are not readily available, use an alcohol-based hand sanitizer with at least 60% alcohol.  . If coughing or sneezing, cover your mouth and nose by coughing or sneezing into the elbow areas of your shirt or coat, into a tissue or into your sleeve (not your hands). . Avoid shaking hands with others and consider head nods or verbal greetings only. . Avoid touching your eyes, nose, or mouth with unwashed hands.  . Avoid close contact with people who are sick. . Avoid places or events with  large numbers of people in one location, like concerts or sporting events. . Carefully consider travel plans you have or are making. . If you are planning any travel outside or inside the [place], visit the CDC's Travelers' Health webpage for the latest health notices. . If you have some symptoms but not all symptoms, continue to monitor at home and seek medical attention if your symptoms worsen. . If you are having a medical emergency, call 911.  HOME CARE . Only take medications as instructed by your medical team. . Drink plenty of fluids and  get plenty of rest. . A steam or ultrasonic humidifier can help if you have congestion.   GET HELP RIGHT AWAY IF YOU HAVE EMERGENCY WARNING SIGNS** FOR COVID-19. If you or someone is showing any of these signs seek emergency medical care immediately. Call 911 or proceed to your closest emergency facility if: . You develop worsening high fever. . Trouble breathing . Bluish lips or face . Persistent pain or pressure in the chest . New confusion . Inability to wake or stay awake . You cough up blood. . Your symptoms become more severe  **This list is not all possible symptoms. Contact your medical provider for any symptoms that are sever or concerning to you.   MAKE SURE YOU   Understand these instructions.  Will watch your condition.  Will get help right away if you are not doing well or get worse.  Your e-visit answers were reviewed by a board certified advanced clinical practitioner to complete your personal care plan.  Depending on the condition, your plan could have included both over the counter or prescription medications.  If there is a problem please reply once you have received a response from your provider.  Your safety is important to [place].  If you have drug allergies check your prescription carefully.    You can use MyChart to ask questions about today's visit, request a non-urgent call back, or ask for a work or school excuse for 24 hours related to this e-Visit. If it has been greater than 24 hours you will need to follow up with your provider, or enter a new e-Visit to address those concerns. You will get an e-mail in the next two days asking about your experience.  I hope that your e-visit has been valuable and will speed your recovery. Thank you for using e-visits.   Approximately 5 minutes was spent documenting and reviewing patient's chart.

## 2019-05-25 ENCOUNTER — Other Ambulatory Visit: Payer: Self-pay

## 2019-05-25 ENCOUNTER — Telehealth: Payer: 59 | Admitting: Family

## 2019-05-25 ENCOUNTER — Encounter (INDEPENDENT_AMBULATORY_CARE_PROVIDER_SITE_OTHER): Payer: Self-pay

## 2019-05-25 DIAGNOSIS — Z20822 Contact with and (suspected) exposure to covid-19: Secondary | ICD-10-CM

## 2019-05-25 DIAGNOSIS — Z20828 Contact with and (suspected) exposure to other viral communicable diseases: Secondary | ICD-10-CM

## 2019-05-25 MED ORDER — BENZONATATE 100 MG PO CAPS: 100.0000 mg | ORAL_CAPSULE | Freq: Three times a day (TID) | ORAL | 0 refills | Status: DC | PRN

## 2019-05-25 NOTE — Progress Notes (Signed)
E-Visit for Corona Virus Screening   Your current symptoms could be consistent with the coronavirus.  Many health care providers can now test patients at their office but not all are.  [facility] has multiple testing sites. For information on our COVID testing locations and hours go to [email or web address]  Please quarantine yourself while awaiting your test results.  We are enrolling you in our [place] for [place] . Daily you will receive a questionnaire within the [place] website. Our COVID 19 response team willl be monitoriing your responses daily.   You can go to one of the  testing sites listed below, while they are opened (see hours). You do not need an order and will stay in your car during the test. You do need to self isolate until your results return and if positive 14 days from when your symptoms started and until you are 3 days symptom free.   Testing Locations (Monday - Friday, 8 a.m. - 3:30 p.m.) . [place]: [facility] at [facility], [address] [place], [place]: [place], [place], [place], [place] (entrance off [address])  . [facility]: (Closed each Monday): Testing site relocated to the short stay covered drive at [facility]. (Use the [facility] entrance to [facility] next to [place] is a respiratory illness with symptoms that are similar to the flu. Symptoms are typically mild to moderate, but there have been cases of severe illness and death due to the virus. The following symptoms may appear 2-14 days after exposure: . Fever . Cough . Shortness of breath or difficulty breathing . Chills . Repeated shaking with chills . Muscle pain . Headache . Sore throat . New loss of taste or smell . Fatigue . Congestion or runny nose . Nausea or vomiting . Diarrhea  It is vitally important that if you feel  that you have an infection such as this virus or any other virus that you stay home and away from places where you may spread it to others.  You should self-quarantine for 14 days if you have symptoms that could potentially be coronavirus or have been in close contact a with a person diagnosed with COVID-19 within the last 2 weeks. You should avoid contact with people age 66 and older.   You should wear a mask or cloth face covering over your nose and mouth if you must be around other people or animals, including pets (even at home). Try to stay at least 6 feet away from other people. This will protect the people around you.  You can use medication such as A prescription cough medication called Tessalon Perles 100 mg. You may take 1-2 capsules every 8 hours as needed for cough.  You may also take acetaminophen (Tylenol) as needed for fever.   Reduce your risk of any infection by using the same precautions used for avoiding the common cold or flu:  Marland Kitchen Wash your hands often with soap and warm water for at least 20 seconds.  If soap and water are not readily available, use an alcohol-based hand sanitizer with at least 60% alcohol.  . If coughing or sneezing, cover your mouth and nose by coughing or sneezing into the elbow areas of your shirt or coat, into a tissue or into your sleeve (not your hands). . Avoid shaking hands with others and consider head nods or verbal greetings only. Marland Kitchen  Avoid touching your eyes, nose, or mouth with unwashed hands.  . Avoid close contact with people who are sick. . Avoid places or events with large numbers of people in one location, like concerts or sporting events. . Carefully consider travel plans you have or are making. . If you are planning any travel outside or inside the [place], visit the CDC's Travelers' Health webpage for the latest health notices. . If you have some symptoms but not all symptoms, continue to monitor at home and seek medical attention if your symptoms  worsen. . If you are having a medical emergency, call 911.  HOME CARE . Only take medications as instructed by your medical team. . Drink plenty of fluids and get plenty of rest. . A steam or ultrasonic humidifier can help if you have congestion.   GET HELP RIGHT AWAY IF YOU HAVE EMERGENCY WARNING SIGNS** FOR COVID-19. If you or someone is showing any of these signs seek emergency medical care immediately. Call 911 or proceed to your closest emergency facility if: . You develop worsening high fever. . Trouble breathing . Bluish lips or face . Persistent pain or pressure in the chest . New confusion . Inability to wake or stay awake . You cough up blood. . Your symptoms become more severe  **This list is not all possible symptoms. Contact your medical provider for any symptoms that are sever or concerning to you.   MAKE SURE YOU   Understand these instructions.  Will watch your condition.  Will get help right away if you are not doing well or get worse.  Your e-visit answers were reviewed by a board certified advanced clinical practitioner to complete your personal care plan.  Depending on the condition, your plan could have included both over the counter or prescription medications.  If there is a problem please reply once you have received a response from your provider.  Your safety is important to [place].  If you have drug allergies check your prescription carefully.    You can use MyChart to ask questions about today's visit, request a non-urgent call back, or ask for a work or school excuse for 24 hours related to this e-Visit. If it has been greater than 24 hours you will need to follow up with your provider, or enter a new e-Visit to address those concerns. You will get an e-mail in the next two days asking about your experience.  I hope that your e-visit has been valuable and will speed your recovery. Thank you for using e-visits.

## 2019-05-26 ENCOUNTER — Encounter (INDEPENDENT_AMBULATORY_CARE_PROVIDER_SITE_OTHER): Payer: Self-pay

## 2019-05-27 ENCOUNTER — Encounter (INDEPENDENT_AMBULATORY_CARE_PROVIDER_SITE_OTHER): Payer: Self-pay

## 2019-05-27 LAB — NOVEL CORONAVIRUS, NAA: SARS-CoV-2, NAA: NOT DETECTED

## 2019-05-28 ENCOUNTER — Encounter (INDEPENDENT_AMBULATORY_CARE_PROVIDER_SITE_OTHER): Payer: Self-pay

## 2019-05-28 ENCOUNTER — Telehealth: Payer: Self-pay

## 2019-05-28 NOTE — Telephone Encounter (Signed)
Left a vm for patient to callback regarding questionnaire. 

## 2019-06-02 ENCOUNTER — Encounter (INDEPENDENT_AMBULATORY_CARE_PROVIDER_SITE_OTHER): Payer: Self-pay

## 2019-11-18 ENCOUNTER — Emergency Department (HOSPITAL_COMMUNITY): Payer: 59

## 2019-11-18 ENCOUNTER — Emergency Department (HOSPITAL_COMMUNITY)
Admission: EM | Admit: 2019-11-18 | Discharge: 2019-11-18 | Disposition: A | Discharge status: Home / Self Care | Payer: 59 | Attending: Emergency Medicine | Admitting: Emergency Medicine

## 2019-11-18 ENCOUNTER — Other Ambulatory Visit: Payer: Self-pay

## 2019-11-18 DIAGNOSIS — Z7901 Long term (current) use of anticoagulants: Secondary | ICD-10-CM | POA: Insufficient documentation

## 2019-11-18 DIAGNOSIS — R1013 Epigastric pain: Secondary | ICD-10-CM | POA: Diagnosis not present

## 2019-11-18 DIAGNOSIS — Z7982 Long term (current) use of aspirin: Secondary | ICD-10-CM | POA: Insufficient documentation

## 2019-11-18 DIAGNOSIS — R079 Chest pain, unspecified: Secondary | ICD-10-CM

## 2019-11-18 DIAGNOSIS — I10 Essential (primary) hypertension: Secondary | ICD-10-CM | POA: Diagnosis not present

## 2019-11-18 DIAGNOSIS — I2510 Atherosclerotic heart disease of native coronary artery without angina pectoris: Secondary | ICD-10-CM | POA: Diagnosis not present

## 2019-11-18 DIAGNOSIS — Z959 Presence of cardiac and vascular implant and graft, unspecified: Secondary | ICD-10-CM | POA: Insufficient documentation

## 2019-11-18 DIAGNOSIS — Z79899 Other long term (current) drug therapy: Secondary | ICD-10-CM | POA: Insufficient documentation

## 2019-11-18 DIAGNOSIS — R0789 Other chest pain: Secondary | ICD-10-CM | POA: Diagnosis present

## 2019-11-18 LAB — COMPREHENSIVE METABOLIC PANEL
ALT: 29 U/L (ref 0–44)
AST: 21 U/L (ref 15–41)
Albumin: 3.5 g/dL (ref 3.5–5.0)
Alkaline Phosphatase: 49 U/L (ref 38–126)
Anion gap: 12 (ref 5–15)
BUN: 17 mg/dL (ref 6–20)
CO2: 24 mmol/L (ref 22–32)
Calcium: 9.8 mg/dL (ref 8.9–10.3)
Chloride: 103 mmol/L (ref 98–111)
Creatinine, Ser: 1.18 mg/dL (ref 0.61–1.24)
GFR calc Af Amer: >60 mL/min (ref >60)
GFR calc non Af Amer: >60 mL/min (ref >60)
Glucose, Bld: 187 mg/dL — ABNORMAL HIGH (ref 70–99)
Potassium: 4.2 mmol/L (ref 3.5–5.1)
Sodium: 139 mmol/L (ref 135–145)
Total Bilirubin: 0.4 mg/dL (ref 0.3–1.2)
Total Protein: 6 g/dL — ABNORMAL LOW (ref 6.5–8.1)

## 2019-11-18 LAB — CBC WITH DIFFERENTIAL/PLATELET
Abs Immature Granulocytes: 0.05 10*3/uL (ref 0.00–0.07)
Basophils Absolute: 0 10*3/uL (ref 0.0–0.1)
Basophils Relative: 0 %
Eosinophils Absolute: 0.1 10*3/uL (ref 0.0–0.5)
Eosinophils Relative: 1 %
HCT: 42.6 % (ref 39.0–52.0)
Hemoglobin: 14.2 g/dL (ref 13.0–17.0)
Immature Granulocytes: 1 %
Lymphocytes Relative: 25 %
Lymphs Abs: 2.4 10*3/uL (ref 0.7–4.0)
MCH: 28.8 pg (ref 26.0–34.0)
MCHC: 33.3 g/dL (ref 30.0–36.0)
MCV: 86.4 fL (ref 80.0–100.0)
Monocytes Absolute: 0.8 10*3/uL (ref 0.1–1.0)
Monocytes Relative: 9 %
Neutro Abs: 6.2 10*3/uL (ref 1.7–7.7)
Neutrophils Relative %: 64 %
Platelets: 242 10*3/uL (ref 150–400)
RBC: 4.93 MIL/uL (ref 4.22–5.81)
RDW: 13.9 % (ref 11.5–15.5)
WBC: 9.5 10*3/uL (ref 4.0–10.5)
nRBC: 0 % (ref 0.0–0.2)

## 2019-11-18 LAB — TROPONIN I (HIGH SENSITIVITY)
Troponin I (High Sensitivity): 16 ng/L (ref <18)
Troponin I (High Sensitivity): 15 ng/L (ref <18)

## 2019-11-18 LAB — LIPASE, BLOOD: Lipase: 23 U/L (ref 11–51)

## 2019-11-18 NOTE — ED Provider Notes (Signed)
[facility] EMERGENCY DEPARTMENT Provider Note   CSN: [id number] Arrival date & time: 11/18/19  1439     History Chief Complaint  Patient presents with  . Chest Pain    Mark Melendez is a 59 y.o. male.  Patient is a 59 year old male with a history of hypertension and coronary artery disease status post 2 stents 11 years ago who presents today with chest pain.  Patient reports that he was sitting at his desk when the pain started.  The events surrounding this were to 10 minutes prior to the pain starting he had eaten some unhealthy food and then had a stressful encounter with an employee that caused him to become very upset.  He said shortly after the employee left his office he started to having this discomfort initially in his abdomen that then moved up into his lower chest.  He described it as a tight searing type of pain that took his breath away.  He took 1 nitroglycerin in the office without any improvement and then took a second 1 that started to cause some improvement but then started making him feel lightheaded and diaphoretic.  When EMS arrived they gave him 2 more nitroglycerin and the pain completely resolved.  He denies any radiation of pain into his arms or his neck but may have had slight radiation to his left lower shoulder blade.  He denies any recent illness and denies any symptoms with exertion.  He states he does have quite a bit of heartburn and takes Tums regularly.  Also he reports that last night he drank 24 ounces of a daiquiri which is much more than he usually drinks which is usually 1 glass of wine per night.  He has had no recent medication changes and follows up with Dr. Sharyn Lull for his heart care.  He last had cardiac evaluation in December prior to having any surgery.  The history is provided by the patient.  Chest Pain Pain location:  Epigastric (lower chest)      Past Medical History:  Diagnosis Date  . Coronary artery disease   .  Hypertension     Patient Active Problem List   Diagnosis Date Noted  . Acute coronary syndrome (HCC) 03/14/2015    Past Surgical History:  Procedure Laterality Date  . CORONARY STENT PLACEMENT     4 stents       No family history on file.  Social History   Tobacco Use  . Smoking status: Never Smoker  Substance Use Topics  . Alcohol use: Yes  . Drug use: No    Home Medications Prior to Admission medications   Medication Sig Start Date End Date Taking? Authorizing Provider  aspirin EC 81 MG EC tablet Take 1 tablet (81 mg total) by mouth daily. 03/15/15   Rinaldo Cloud, MD  benzonatate (TESSALON PERLES) 100 MG capsule Take 1 capsule (100 mg total) by mouth 3 (three) times daily as needed. 05/25/19   Jannifer Rodney A, FNP  clopidogrel (PLAVIX) 75 MG tablet Take 75 mg by mouth daily.    [provider]  esomeprazole (NEXIUM) 40 MG capsule Take 1 capsule (40 mg total) by mouth daily at 12 noon. 03/14/15   Rinaldo Cloud, MD  fish oil-omega-3 fatty acids 1000 MG capsule Take 1 g by mouth daily.    [provider]  lisinopril (PRINIVIL,ZESTRIL) 5 MG tablet Take 5 mg by mouth daily.    [provider]  metoprolol succinate (TOPROL-XL) 25  MG 24 hr tablet Take 25 mg by mouth daily.    [provider]  Multiple Vitamins-Minerals (MULTIVITAMIN PO) Take 1 tablet by mouth daily.    [provider]  nitroGLYCERIN (NITROSTAT) 0.4 MG SL tablet Place 1 tablet (0.4 mg total) under the tongue every 5 (five) minutes x 3 doses as needed for chest pain. 03/14/15   Charolette Forward, MD  simvastatin (ZOCOR) 80 MG tablet Take 80 mg by mouth at bedtime.    [provider]    Allergies    Patient has no known allergies.  Review of Systems   Review of Systems  Cardiovascular: Positive for chest pain.  All other systems reviewed and are negative.   Physical Exam Updated Vital Signs BP 118/75   Pulse 81   Temp 98.8 F (37.1 C)   Resp 16   Ht 5'  10" (1.778 m)   Wt 90.7 kg   SpO2 97%   BMI 28.70 kg/m   Physical Exam Vitals and nursing note reviewed.  Constitutional:      General: He is not in acute distress.    Appearance: He is well-developed.  HENT:     Head: Normocephalic and atraumatic.  Eyes:     Conjunctiva/sclera: Conjunctivae normal.     Pupils: Pupils are equal, round, and reactive to light.  Cardiovascular:     Rate and Rhythm: Normal rate and regular rhythm.     Heart sounds: No murmur.  Pulmonary:     Effort: Pulmonary effort is normal. No respiratory distress.     Breath sounds: Normal breath sounds. No wheezing or rales.  Abdominal:     General: There is no distension.     Palpations: Abdomen is soft.     Tenderness: There is no abdominal tenderness. There is no guarding or rebound.  Musculoskeletal:        General: No tenderness. Normal range of motion.     Cervical back: Normal range of motion and neck supple.  Skin:    General: Skin is warm and dry.     Findings: No erythema or rash.  Neurological:     Mental Status: He is alert and oriented to person, place, and time.  Psychiatric:        Behavior: Behavior normal.     ED Results / Procedures / Treatments   Labs (all labs ordered are listed, but only abnormal results are displayed) Labs Reviewed  CBC WITH DIFFERENTIAL/PLATELET  COMPREHENSIVE METABOLIC PANEL  LIPASE, BLOOD  TROPONIN I (HIGH SENSITIVITY)    EKG EKG Interpretation  Date/Time:  Wednesday November 18 2019 14:41:07 EDT Ventricular Rate:  86 PR Interval:    QRS Duration: 88 QT Interval:  366 QTC Calculation: 438 R Axis:   55 Text Interpretation: Sinus rhythm Atrial premature complex No significant change since last tracing Confirmed by Blanchie Dessert [phone]) on 11/18/2019 3:29:42 PM   Radiology No results found.  Procedures Procedures (including critical care time)  Medications Ordered in ED Medications - No data to display  ED Course  I have reviewed the  triage vital signs and the nursing notes.  Pertinent labs & imaging results that were available during my care of the patient were reviewed by me and considered in my medical decision making (see chart for details).    MDM Rules/Calculators/A&P                      59 year old male with significant cardiac past medical history presenting  today with epigastric and lower chest discomfort that started after a stressful encounter with an employee and shortly after eating.  Patient's pain completely resolved after 4 nitroglycerin.  He is currently pain-free at this time with a normal unchanged EKG and a normal exam.  Low suspicion for PE, dissection or infectious etiology.  Concerned that this could be ACS however also high possibility that this could be esophageal spasm.  No symptoms to suggest cholecystitis at this time.  Patient did not become diaphoretic or dizzy until after taking nitroglycerin and then attempting to stand up and walk to the stretcher.  We will do a chest x-ray, CBC, CMP and troponin.  Patient symptoms started at noon today and lasted for 35 minutes.  He has been pain-free since that time.  The time from when troponin was drawn has been greater than 3 hours since the pain occurred.  Final Clinical Impression(s) / ED Diagnoses Final diagnoses:  None    Rx / DC Orders ED Discharge Orders    None       Gwyneth Sprout, MD 11/18/19 [phone]

## 2019-11-18 NOTE — ED Provider Notes (Signed)
  Physical Exam  BP 123/86   Pulse 66   Temp 98.8 F (37.1 C)   Resp 13   Ht 5\' 10"  (1.778 m)   Wt 90.7 kg   SpO2 94%   BMI 28.70 kg/m   Physical Exam  ED Course/Procedures     Procedures  MDM  Received patient in signout.  Nonspecific chest pain.  Began after eating.  Does have cardiac history however.  EKG reassuring.  Troponin negative x2.  Feels better.  Think stable for discharge.  Outpatient follow-up as needed       , MD 11/18/19 11/20/19

## 2019-11-18 NOTE — ED Notes (Signed)
The pt is alert no pain

## 2019-11-18 NOTE — ED Notes (Signed)
Got patient undress on the monitor did ekg shown to Dr Plunkett patient is resting with call bell in reach 

## 2019-11-18 NOTE — ED Triage Notes (Signed)
Pt arrives by EMS with complaints of chest pain. Pt was at work sitting at his desk when he began to have epigastric pain. Pt thought it was acid-reflux and took 2 tums. Pain continued of 9/10 Pt then took 1 nitro (expired) with no relief. Pt took 2nd nitro with some relief.  EMS arrived pt was pale and diaphoretic. EMS gave pt 3rd nitro with relief.  125/75 HR 91 RR 20 96% RA CBG 295

## 2019-11-19 ENCOUNTER — Other Ambulatory Visit: Payer: Self-pay | Admitting: Cardiology

## 2019-11-19 ENCOUNTER — Other Ambulatory Visit (HOSPITAL_COMMUNITY): Payer: Self-pay | Admitting: Cardiology

## 2019-11-19 DIAGNOSIS — R079 Chest pain, unspecified: Secondary | ICD-10-CM

## 2019-12-07 ENCOUNTER — Ambulatory Visit (HOSPITAL_COMMUNITY)
Admission: RE | Admit: 2019-12-07 | Discharge: 2019-12-07 | Disposition: A | Discharge status: Home / Self Care | Payer: No Typology Code available for payment source | Source: Ambulatory Visit | Attending: Cardiology | Admitting: Cardiology

## 2019-12-07 ENCOUNTER — Ambulatory Visit (HOSPITAL_BASED_OUTPATIENT_CLINIC_OR_DEPARTMENT_OTHER)
Admission: RE | Admit: 2019-12-07 | Discharge: 2019-12-07 | Disposition: A | Discharge status: Home / Self Care | Payer: No Typology Code available for payment source | Source: Ambulatory Visit

## 2019-12-07 ENCOUNTER — Encounter (HOSPITAL_COMMUNITY): Payer: Self-pay

## 2019-12-07 ENCOUNTER — Other Ambulatory Visit: Payer: Self-pay

## 2019-12-07 DIAGNOSIS — R079 Chest pain, unspecified: Secondary | ICD-10-CM

## 2019-12-07 MED ORDER — REGADENOSON 0.4 MG/5ML IV SOLN
INTRAVENOUS | Status: AC
  Filled 2019-12-07: qty 5

## 2019-12-07 MED ORDER — REGADENOSON 0.4 MG/5ML IV SOLN
INTRAVENOUS | Status: AC
  Administered 2019-12-07: 11:00:00 0.4 mg
  Filled 2019-12-07: qty 5

## 2019-12-07 MED ORDER — TECHNETIUM TC 99M TETROFOSMIN IV KIT
10.0000 | PACK | Freq: Once | INTRAVENOUS | Status: AC | PRN
  Administered 2019-12-07: 10:00:00 10 via INTRAVENOUS

## 2019-12-07 MED ORDER — REGADENOSON 0.4 MG/5ML IV SOLN: 0.4000 mg | Freq: Once | INTRAVENOUS | Status: DC

## 2019-12-07 MED ORDER — TECHNETIUM TC 99M TETROFOSMIN IV KIT
32.1000 | PACK | Freq: Once | INTRAVENOUS | Status: AC | PRN
  Administered 2019-12-07: 11:00:00 32.1 via INTRAVENOUS

## 2020-05-11 ENCOUNTER — Ambulatory Visit (INDEPENDENT_AMBULATORY_CARE_PROVIDER_SITE_OTHER): Payer: No Typology Code available for payment source | Admitting: Orthopaedic Surgery

## 2020-05-11 ENCOUNTER — Ambulatory Visit: Payer: Self-pay

## 2020-05-11 DIAGNOSIS — Z96651 Presence of right artificial knee joint: Secondary | ICD-10-CM | POA: Diagnosis not present

## 2020-05-11 DIAGNOSIS — G8929 Other chronic pain: Secondary | ICD-10-CM | POA: Diagnosis not present

## 2020-05-11 DIAGNOSIS — M25561 Pain in right knee: Secondary | ICD-10-CM

## 2020-05-11 NOTE — Progress Notes (Signed)
Office Visit Note   Patient: Mark Melendez           Date of Birth: 11-13-1960           MRN: [id number] Visit Date: 05/11/2020              Requested by: [facility] [address] [place],  [place] [address] PCP: [facility]   Assessment & Plan: Visit Diagnoses:  1. Chronic pain of right knee   2. 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  I told him that I have asked that patients under my own care where I have performed a knee replacement and then needed to upsize letter probably within less than a year after the original surgery due to instability of the knee that was not appreciated at the time of surgery.  This certainly has happened in my hands with my and patient population.  It is concerning that a significant jump and thickness of the liner has not completely helped with the patient's instability symptoms and signs.  There is certainly instability on my exam of that knee.  This is surprising given how well the x-rays look and how the knee looks overall, but, it is what it is in terms of the symptoms that the patient is exhibiting.  I think at this point he will certainly need another surgery to stabilize his knee and this could even be more likely a revision to some type of constrained liner.  He states that he appreciates my thoughts about this.  I did let him know that the surgeon that is his primary orthopedic surgeon is very meticulous and very thoughtful and cerebral as it pertains to knees such as this and he is in good hands in terms of what of the recommendations that that surgery may have for him.  I am happy to get involved in any way if needed.  All questions and concerns were answered and addressed.  Follow-up can be as needed.  Follow-Up Instructions: Return if symptoms worsen or fail to improve.   Orders:  Orders Placed  This Encounter  Procedures  . XR Knee 1-2 Views Right   No orders of the defined types were placed in this encounter.     Procedures: No procedures performed   Clinical Data: No additional findings.   Subjective: Chief Complaint  Patient presents with  . Right Knee - Pain  The patient is a very pleasant 59 he had a primary total knee arthroplasty in December 2020.  This was due to significant arthritis in his knee.  After continued swelling and symptoms of instability, the primary surgeon took him back to the operating room in July of this year for an upsizing of his polyliner.  This was a DePuy attune knee and the patient reports that his original polythickness was 5 and his knee was revised to a size 9 polyliner.  Since July the patient has still had knee swelling and symptoms of instability of his right knee.  He came to me for second opinion as it relates to what he is dealing with with this knee.  He is having some pain but is appropriate from having surgery but what is more concerning to him is the instability symptoms.  He said no other acute change in medical status.  He is  a diabetic and reports a hemoglobin A1c of 7.  HPI  Review of Systems He currently denies any headache, chest pain, shortness of breath, fever, chills, nausea, vomiting  Objective: Vital Signs: There were no vitals taken for this visit.  Physical Exam He is alert and orient x3 and in no acute distress Ortho Exam Examination of his right operative knee shows a well-healed midline surgical incision and and old incision medially from previous surgery at a younger age.  There is a moderate knee joint effusion on the right knee.  There is no instability with varus and valgus stressing at full extension and no instability with varus and valgus stressing with the knee flexed at 30 degrees.  There is plate in the knee with a positive drawer sign.  There is instability when I pull the knee forward with it flexed at  90 degrees and even flex to just slightly past 30 degrees there is definitely plate in the knee. Specialty Comments:  No specialty comments available.  Imaging: XR Knee 1-2 Views Right  Result Date: 05/11/2020 2 views of the right and no complicating features.  There is no evidence of loosening of the hardware.    PMFS History: Patient Active Problem List   Diagnosis Date Noted  . Acute coronary syndrome (HCC) 03/14/2015   Past Medical History:  Diagnosis Date  . Coronary artery disease   . Hypertension     No family history on file.  Past Surgical History:  Procedure Laterality Date  . CORONARY STENT PLACEMENT     4 stents   Social History   Occupational History  . Not on file  Tobacco Use  . Smoking status: Never Smoker  Substance and Sexual Activity  . Alcohol use: Yes  . Drug use: No  . Sexual activity: Not on file

## 2021-08-07 DIAGNOSIS — Z125 Encounter for screening for malignant neoplasm of prostate: Secondary | ICD-10-CM | POA: Diagnosis not present

## 2021-08-07 DIAGNOSIS — R350 Frequency of micturition: Secondary | ICD-10-CM | POA: Diagnosis not present

## 2021-08-07 DIAGNOSIS — M545 Low back pain, unspecified: Secondary | ICD-10-CM | POA: Diagnosis not present

## 2021-08-07 DIAGNOSIS — R399 Unspecified symptoms and signs involving the genitourinary system: Secondary | ICD-10-CM | POA: Diagnosis not present

## 2021-09-11 DIAGNOSIS — R351 Nocturia: Secondary | ICD-10-CM | POA: Diagnosis not present

## 2021-09-11 DIAGNOSIS — M5442 Lumbago with sciatica, left side: Secondary | ICD-10-CM | POA: Diagnosis not present

## 2021-09-11 DIAGNOSIS — M5441 Lumbago with sciatica, right side: Secondary | ICD-10-CM | POA: Diagnosis not present

## 2021-09-15 DIAGNOSIS — I25118 Atherosclerotic heart disease of native coronary artery with other forms of angina pectoris: Secondary | ICD-10-CM | POA: Diagnosis not present

## 2021-09-15 DIAGNOSIS — E1169 Type 2 diabetes mellitus with other specified complication: Secondary | ICD-10-CM | POA: Diagnosis not present

## 2021-09-15 DIAGNOSIS — I10 Essential (primary) hypertension: Secondary | ICD-10-CM | POA: Diagnosis not present

## 2021-09-29 DIAGNOSIS — Z20822 Contact with and (suspected) exposure to covid-19: Secondary | ICD-10-CM | POA: Diagnosis not present

## 2021-09-29 DIAGNOSIS — I2510 Atherosclerotic heart disease of native coronary artery without angina pectoris: Secondary | ICD-10-CM | POA: Diagnosis not present

## 2021-09-29 DIAGNOSIS — Z0000 Encounter for general adult medical examination without abnormal findings: Secondary | ICD-10-CM | POA: Diagnosis not present

## 2021-09-29 DIAGNOSIS — Z23 Encounter for immunization: Secondary | ICD-10-CM | POA: Diagnosis not present

## 2021-09-29 DIAGNOSIS — I10 Essential (primary) hypertension: Secondary | ICD-10-CM | POA: Diagnosis not present

## 2021-09-29 DIAGNOSIS — E785 Hyperlipidemia, unspecified: Secondary | ICD-10-CM | POA: Diagnosis not present

## 2021-09-29 DIAGNOSIS — J3489 Other specified disorders of nose and nasal sinuses: Secondary | ICD-10-CM | POA: Diagnosis not present

## 2021-09-29 DIAGNOSIS — E1169 Type 2 diabetes mellitus with other specified complication: Secondary | ICD-10-CM | POA: Diagnosis not present

## 2021-11-02 ENCOUNTER — Other Ambulatory Visit: Payer: Self-pay

## 2021-11-02 ENCOUNTER — Emergency Department (HOSPITAL_BASED_OUTPATIENT_CLINIC_OR_DEPARTMENT_OTHER)
Admission: EM | Admit: 2021-11-02 | Discharge: 2021-11-02 | Disposition: A | Discharge status: Home / Self Care | Payer: BC Managed Care – PPO | Attending: Emergency Medicine | Admitting: Emergency Medicine

## 2021-11-02 ENCOUNTER — Encounter (HOSPITAL_BASED_OUTPATIENT_CLINIC_OR_DEPARTMENT_OTHER): Payer: Self-pay | Admitting: *Deleted

## 2021-11-02 DIAGNOSIS — J3489 Other specified disorders of nose and nasal sinuses: Secondary | ICD-10-CM | POA: Diagnosis not present

## 2021-11-02 DIAGNOSIS — E119 Type 2 diabetes mellitus without complications: Secondary | ICD-10-CM | POA: Insufficient documentation

## 2021-11-02 DIAGNOSIS — Z20822 Contact with and (suspected) exposure to covid-19: Secondary | ICD-10-CM | POA: Insufficient documentation

## 2021-11-02 DIAGNOSIS — I2510 Atherosclerotic heart disease of native coronary artery without angina pectoris: Secondary | ICD-10-CM | POA: Insufficient documentation

## 2021-11-02 DIAGNOSIS — I10 Essential (primary) hypertension: Secondary | ICD-10-CM | POA: Insufficient documentation

## 2021-11-02 DIAGNOSIS — R0981 Nasal congestion: Secondary | ICD-10-CM | POA: Insufficient documentation

## 2021-11-02 DIAGNOSIS — Z7984 Long term (current) use of oral hypoglycemic drugs: Secondary | ICD-10-CM | POA: Insufficient documentation

## 2021-11-02 DIAGNOSIS — Z7982 Long term (current) use of aspirin: Secondary | ICD-10-CM | POA: Insufficient documentation

## 2021-11-02 DIAGNOSIS — R0602 Shortness of breath: Secondary | ICD-10-CM | POA: Diagnosis not present

## 2021-11-02 DIAGNOSIS — Z79899 Other long term (current) drug therapy: Secondary | ICD-10-CM | POA: Insufficient documentation

## 2021-11-02 LAB — RESP PANEL BY RT-PCR (FLU A&B, COVID) ARPGX2
Influenza A by PCR: NEGATIVE
Influenza B by PCR: NEGATIVE
SARS Coronavirus 2 by RT PCR: NEGATIVE

## 2021-11-02 NOTE — ED Provider Notes (Signed)
? ?[facility]  ?Provider Note ? ?CSN: [id number] ?Arrival date & time: 11/02/21 0175 ? ?History ?Chief Complaint  ?Patient presents with  ? Nasal Congestion  ? Shortness of Breath  ? ? ?Mark Melendez is a 61 y.o. male with history of HTN, DM and CAD reports he has been using flonase for the last 2 weeks in anticipation of allergic rhinitis he usually gets this time of year. In the last 3 days he has had worsening nasal congestion, improved temporarily with Afrin but during the night while wearing his nasal CPAP mask his congestion returned and he began to feel choked. He has also tried Sudafed without improvement before coming to the ED. He has not had any fever, cough, sore throat. He has some dry mouth from mouth-breathing.  ? ? ?Home Medications ?Prior to Admission medications   ?Medication Sig Start Date End Date Taking? Authorizing Provider  ?aspirin 325 MG EC tablet Take 325 mg by mouth daily.   Yes [provider]  ?aspirin EC 81 MG EC tablet Take 1 tablet (81 mg total) by mouth daily. 03/15/15  Yes Rinaldo Cloud, MD  ?ezetimibe (ZETIA) 10 MG tablet Take 10 mg by mouth daily. 10/02/19  Yes [provider]  ?fish oil-omega-3 fatty acids 1000 MG capsule Take 1 g by mouth daily.   Yes [provider]  ?fluticasone (FLONASE ALLERGY RELIEF) 50 MCG/ACT nasal spray 1 spray in each nostril 09/06/14  Yes [provider]  ?lisinopril (ZESTRIL) 20 MG tablet Take 20 mg by mouth every evening.  10/02/19  Yes [provider]  ?metFORMIN (GLUCOPHAGE-XR) 500 MG 24 hr tablet Take 2,500 mg by mouth See admin instructions. 1000mg  in the morning, and 500mg  at bedtime. 09/21/19  Yes [provider]  ?metoprolol succinate (TOPROL-XL) 25 MG 24 hr tablet Take 25 mg by mouth daily.   Yes [provider]  ?Multiple Vitamins-Minerals (MULTIVITAMIN PO) Take 1 tablet by mouth daily.   Yes [provider]  ?nitroGLYCERIN (NITROSTAT) 0.4 MG SL  tablet Place 1 tablet (0.4 mg total) under the tongue every 5 (five) minutes x 3 doses as needed for chest pain. 03/14/15  Yes 09/23/19, MD  ?rosuvastatin (CRESTOR) 20 MG tablet Take 20 mg by mouth daily. 09/24/19  Yes [provider]  ?simvastatin (ZOCOR) 80 MG tablet Take 80 mg by mouth at bedtime.   Yes [provider]  ?benzonatate (TESSALON PERLES) 100 MG capsule Take 1 capsule (100 mg total) by mouth 3 (three) times daily as needed. ?Patient not taking: Reported on 11/18/2019 05/25/19   11/20/2019, FNP  ?esomeprazole (NEXIUM) 40 MG capsule Take 1 capsule (40 mg total) by mouth daily at 12 noon. ?Patient not taking: Reported on 11/18/2019 03/14/15   11/20/2019, MD  ?JARDIANCE 25 MG TABS tablet Take 25 mg by mouth daily. 10/02/21   [provider]  ?lisinopril (ZESTRIL) 20 MG tablet 1 tablet    [provider]  ? ? ? ?Allergies    ?Patient has no known allergies. ? ? ?Review of Systems   ?Review of Systems ?Please see HPI for pertinent positives and negatives ? ?Physical Exam ?BP 115/79   Pulse (!) 58   Temp 98.1 ?F (36.7 ?C) (Oral)   Resp 16   Ht 5\' 10"  (1.778 m)   Wt 92.1 kg   SpO2 99%   BMI 29.13 kg/m?  ? ?Physical Exam ?Vitals and nursing note reviewed.  ?Constitutional:   ?  Appearance: Normal appearance.  ?HENT:  ?   Head: Normocephalic and atraumatic.  ?   Nose: Congestion and rhinorrhea present.  ?   Mouth/Throat:  ?   Mouth: Mucous membranes are dry.  ?   Pharynx: No oropharyngeal exudate or posterior oropharyngeal erythema.  ?Eyes:  ?   Extraocular Movements: Extraocular movements intact.  ?   Conjunctiva/sclera: Conjunctivae normal.  ?Cardiovascular:  ?   Rate and Rhythm: Normal rate.  ?Pulmonary:  ?   Effort: Pulmonary effort is normal.  ?   Breath sounds: Normal breath sounds. No decreased breath sounds, wheezing or rales.  ?Abdominal:  ?   General: Abdomen is flat.  ?   Palpations: Abdomen is soft.  ?   Tenderness: There is no abdominal  tenderness.  ?Musculoskeletal:     ?   General: No swelling. Normal range of motion.  ?   Cervical back: Neck supple.  ?Lymphadenopathy:  ?   Cervical: No cervical adenopathy.  ?Skin: ?   General: Skin is warm and dry.  ?Neurological:  ?   General: No focal deficit present.  ?   Mental Status: He is alert.  ?Psychiatric:     ?   Mood and Affect: Mood normal.  ? ? ?ED Results / Procedures / Treatments   ?EKG ?EKG Interpretation ? ?Date/Time:  Thursday November 02 2021 04:30:07 EDT ?Ventricular Rate:  63 ?PR Interval:  195 ?QRS Duration: 92 ?QT Interval:  405 ?QTC Calculation: 415 ?R Axis:   69 ?Text Interpretation: Sinus rhythm Normal ECG No significant change since last tracing Confirmed by Susy Frizzle [phone]) on 11/02/2021 4:32:36 AM ? ?Procedures ?Procedures ? ?Medications Ordered in the ED ?Medications - No data to display ? ?Initial Impression and Plan ? Patient here with nasal congestion, not responding well to the usual therapies. He had an episode of SOB/choking while using his CPAP but is now feeling better. Vitals are normal. Will check Covid as he is high risk and may benefit from Paxlovid if positive.  ? ?ED Course  ? ?Clinical Course as of 11/02/21 0550  ?Thu Nov 02, 2021  ?0546 Covid/Flu are negative. Patient remains well appearing with normal vitals. Recommend he add antihistamine to his current regimen. Use decongestants with caution given his co-morbidities, can try Coricidin.  [CS]  ?  ?Clinical Course User Index ?[CS] Pollyann Savoy, MD  ? ? ? ?MDM Rules/Calculators/A&P ?Medical Decision Making ?Problems Addressed: ?Nasal congestion: acute illness or injury ? ?Amount and/or Complexity of Data Reviewed ?Labs: ordered. Decision-making details documented in ED Course. ? ?Risk ?OTC drugs. ? ? ? ?Final Clinical Impression(s) / ED Diagnoses ?Final diagnoses:  ?Nasal congestion  ? ? ?Rx / DC Orders ?ED Discharge Orders   ? ? None  ? ?  ? ?  ?Pollyann Savoy, MD ?11/02/21 [phone] ? ?

## 2021-11-02 NOTE — ED Triage Notes (Addendum)
Nasal congestion for about 2-3 days, has Afrin, sudafed and nasal strips.Reports he has been breathing through his mouth because he is so congested and now it seems harder to breath. Dry cough and denies fevers or pain. Lung sounds clear, saturations 100% on room air. Uses CPAP, says it felt like it was gagging him because he was so congested.  ?

## 2021-11-13 DIAGNOSIS — M5459 Other low back pain: Secondary | ICD-10-CM | POA: Diagnosis not present

## 2021-11-20 DIAGNOSIS — M5459 Other low back pain: Secondary | ICD-10-CM | POA: Diagnosis not present

## 2021-11-22 DIAGNOSIS — M5459 Other low back pain: Secondary | ICD-10-CM | POA: Diagnosis not present

## 2021-12-04 DIAGNOSIS — M5459 Other low back pain: Secondary | ICD-10-CM | POA: Diagnosis not present

## 2021-12-06 DIAGNOSIS — M5459 Other low back pain: Secondary | ICD-10-CM | POA: Diagnosis not present

## 2021-12-11 DIAGNOSIS — M5459 Other low back pain: Secondary | ICD-10-CM | POA: Diagnosis not present

## 2021-12-20 DIAGNOSIS — M5441 Lumbago with sciatica, right side: Secondary | ICD-10-CM | POA: Diagnosis not present

## 2021-12-20 DIAGNOSIS — M5136 Other intervertebral disc degeneration, lumbar region: Secondary | ICD-10-CM | POA: Diagnosis not present

## 2021-12-20 DIAGNOSIS — M5442 Lumbago with sciatica, left side: Secondary | ICD-10-CM | POA: Diagnosis not present

## 2021-12-22 DIAGNOSIS — E785 Hyperlipidemia, unspecified: Secondary | ICD-10-CM | POA: Diagnosis not present

## 2021-12-22 DIAGNOSIS — E119 Type 2 diabetes mellitus without complications: Secondary | ICD-10-CM | POA: Diagnosis not present

## 2021-12-22 DIAGNOSIS — I10 Essential (primary) hypertension: Secondary | ICD-10-CM | POA: Diagnosis not present

## 2021-12-22 DIAGNOSIS — I25118 Atherosclerotic heart disease of native coronary artery with other forms of angina pectoris: Secondary | ICD-10-CM | POA: Diagnosis not present

## 2022-01-12 DIAGNOSIS — Z01818 Encounter for other preprocedural examination: Secondary | ICD-10-CM | POA: Diagnosis not present

## 2022-01-23 DIAGNOSIS — M545 Low back pain, unspecified: Secondary | ICD-10-CM | POA: Diagnosis not present

## 2022-01-31 DIAGNOSIS — D125 Benign neoplasm of sigmoid colon: Secondary | ICD-10-CM | POA: Diagnosis not present

## 2022-01-31 DIAGNOSIS — Z1211 Encounter for screening for malignant neoplasm of colon: Secondary | ICD-10-CM | POA: Diagnosis not present

## 2022-01-31 DIAGNOSIS — D128 Benign neoplasm of rectum: Secondary | ICD-10-CM | POA: Diagnosis not present

## 2022-01-31 DIAGNOSIS — K648 Other hemorrhoids: Secondary | ICD-10-CM | POA: Diagnosis not present

## 2022-02-12 ENCOUNTER — Other Ambulatory Visit: Payer: Self-pay

## 2022-02-12 ENCOUNTER — Emergency Department (HOSPITAL_COMMUNITY): Payer: BC Managed Care – PPO

## 2022-02-12 ENCOUNTER — Emergency Department (HOSPITAL_COMMUNITY)
Admission: EM | Admit: 2022-02-12 | Discharge: 2022-02-12 | Disposition: A | Discharge status: Home / Self Care | Payer: BC Managed Care – PPO | Attending: Emergency Medicine | Admitting: Emergency Medicine

## 2022-02-12 DIAGNOSIS — I639 Cerebral infarction, unspecified: Secondary | ICD-10-CM | POA: Diagnosis not present

## 2022-02-12 DIAGNOSIS — Z79899 Other long term (current) drug therapy: Secondary | ICD-10-CM | POA: Insufficient documentation

## 2022-02-12 DIAGNOSIS — R29818 Other symptoms and signs involving the nervous system: Secondary | ICD-10-CM | POA: Diagnosis not present

## 2022-02-12 DIAGNOSIS — Z7982 Long term (current) use of aspirin: Secondary | ICD-10-CM | POA: Diagnosis not present

## 2022-02-12 DIAGNOSIS — R7309 Other abnormal glucose: Secondary | ICD-10-CM | POA: Insufficient documentation

## 2022-02-12 DIAGNOSIS — R42 Dizziness and giddiness: Secondary | ICD-10-CM | POA: Diagnosis not present

## 2022-02-12 LAB — CBC
HCT: 47 % (ref 39.0–52.0)
Hemoglobin: 15.8 g/dL (ref 13.0–17.0)
MCH: 29.3 pg (ref 26.0–34.0)
MCHC: 33.6 g/dL (ref 30.0–36.0)
MCV: 87.2 fL (ref 80.0–100.0)
Platelets: 237 10*3/uL (ref 150–400)
RBC: 5.39 MIL/uL (ref 4.22–5.81)
RDW: 14.1 % (ref 11.5–15.5)
WBC: 8.8 10*3/uL (ref 4.0–10.5)
nRBC: 0 % (ref 0.0–0.2)

## 2022-02-12 LAB — COMPREHENSIVE METABOLIC PANEL
ALT: 22 U/L (ref 0–44)
AST: 21 U/L (ref 15–41)
Albumin: 4.3 g/dL (ref 3.5–5.0)
Alkaline Phosphatase: 46 U/L (ref 38–126)
Anion gap: 11 (ref 5–15)
BUN: 20 mg/dL (ref 8–23)
CO2: 24 mmol/L (ref 22–32)
Calcium: 10.3 mg/dL (ref 8.9–10.3)
Chloride: 103 mmol/L (ref 98–111)
Creatinine, Ser: 1.05 mg/dL (ref 0.61–1.24)
GFR, Estimated: >60 mL/min (ref >60)
Glucose, Bld: 119 mg/dL — ABNORMAL HIGH (ref 70–99)
Potassium: 5.3 mmol/L — ABNORMAL HIGH (ref 3.5–5.1)
Sodium: 138 mmol/L (ref 135–145)
Total Bilirubin: 0.4 mg/dL (ref 0.3–1.2)
Total Protein: 6.9 g/dL (ref 6.5–8.1)

## 2022-02-12 LAB — PROTIME-INR
INR: 1 (ref 0.8–1.2)
Prothrombin Time: 13.2 seconds (ref 11.4–15.2)

## 2022-02-12 LAB — ETHANOL: Alcohol, Ethyl (B): <10 mg/dL (ref <10)

## 2022-02-12 LAB — DIFFERENTIAL
Abs Immature Granulocytes: 0.04 10*3/uL (ref 0.00–0.07)
Basophils Absolute: 0 10*3/uL (ref 0.0–0.1)
Basophils Relative: 0 %
Eosinophils Absolute: 0.1 10*3/uL (ref 0.0–0.5)
Eosinophils Relative: 1 %
Immature Granulocytes: 1 %
Lymphocytes Relative: 24 %
Lymphs Abs: 2.1 10*3/uL (ref 0.7–4.0)
Monocytes Absolute: 0.7 10*3/uL (ref 0.1–1.0)
Monocytes Relative: 8 %
Neutro Abs: 5.9 10*3/uL (ref 1.7–7.7)
Neutrophils Relative %: 66 %

## 2022-02-12 LAB — CBG MONITORING, ED: Glucose-Capillary: 116 mg/dL — ABNORMAL HIGH (ref 70–99)

## 2022-02-12 LAB — APTT: aPTT: 26 seconds (ref 24–36)

## 2022-02-12 MED ORDER — MECLIZINE HCL 25 MG PO TABS: 25.0000 mg | ORAL_TABLET | Freq: Three times a day (TID) | ORAL | 0 refills | Status: DC | PRN

## 2022-02-12 MED ORDER — GADOBUTROL 1 MMOL/ML IV SOLN
10.0000 mL | Freq: Once | INTRAVENOUS | Status: AC | PRN
  Administered 2022-02-12: 10 mL via INTRAVENOUS

## 2022-02-12 MED ORDER — SODIUM CHLORIDE 0.9% FLUSH: 3.0000 mL | Freq: Once | INTRAVENOUS | Status: DC

## 2022-02-12 NOTE — ED Provider Notes (Signed)
[facility] EMERGENCY DEPARTMENT Provider Note   CSN: [id number] Arrival date & time: 02/12/22  1135  An emergency department physician performed an initial assessment on this suspected stroke patient at 1231.  History  Chief Complaint  Patient presents with   Dizziness    Mark Melendez is a 61 y.o. male.  Pt reports he began feeling dizzy this am.  Pt reports he felt normal last pm.  Pt reports he has been drinking plenty of fluids.    The history is provided by the patient. No language interpreter was used.  Dizziness Description: dizziness. Severity:  Moderate Onset quality:  Sudden Duration:  1 day Timing:  Constant Progression:  Worsening Chronicity:  New Context: not when bending over, not with head movement and not with loss of consciousness   Relieved by:  Nothing Worsened by:  Nothing Ineffective treatments:  None tried Associated symptoms: no diarrhea, no nausea, no palpitations, no tinnitus, no vision changes and no vomiting   Risk factors: no anemia and no hx of vertigo        Home Medications Prior to Admission medications   Medication Sig Start Date End Date Taking? Authorizing Provider  aspirin 325 MG EC tablet Take 325 mg by mouth daily.    [provider]  aspirin EC 81 MG EC tablet Take 1 tablet (81 mg total) by mouth daily. 03/15/15   Rinaldo Cloud, MD  benzonatate (TESSALON PERLES) 100 MG capsule Take 1 capsule (100 mg total) by mouth 3 (three) times daily as needed. Patient not taking: Reported on 11/18/2019 05/25/19   Junie Spencer, FNP  esomeprazole (NEXIUM) 40 MG capsule Take 1 capsule (40 mg total) by mouth daily at 12 noon. Patient not taking: Reported on 11/18/2019 03/14/15   Rinaldo Cloud, MD  ezetimibe (ZETIA) 10 MG tablet Take 10 mg by mouth daily. 10/02/19   [provider]  fish oil-omega-3 fatty acids 1000 MG capsule Take 1 g by mouth daily.    [provider]  fluticasone [facility] ALLERGY  RELIEF) 50 MCG/ACT nasal spray 1 spray in each nostril 09/06/14   [provider]  JARDIANCE 25 MG TABS tablet Take 25 mg by mouth daily. 10/02/21   [provider]  lisinopril (ZESTRIL) 20 MG tablet Take 20 mg by mouth every evening.  10/02/19   [provider]  lisinopril (ZESTRIL) 20 MG tablet 1 tablet    [provider]  metFORMIN (GLUCOPHAGE-XR) 500 MG 24 hr tablet Take 2,500 mg by mouth See admin instructions. 1000mg  in the morning, and 500mg  at bedtime. 09/21/19   [provider]  metoprolol succinate (TOPROL-XL) 25 MG 24 hr tablet Take 25 mg by mouth daily.    [provider]  Multiple Vitamins-Minerals (MULTIVITAMIN PO) Take 1 tablet by mouth daily.    [provider]  nitroGLYCERIN (NITROSTAT) 0.4 MG SL tablet Place 1 tablet (0.4 mg total) under the tongue every 5 (five) minutes x 3 doses as needed for chest pain. 03/14/15   09/23/19, MD  rosuvastatin (CRESTOR) 20 MG tablet Take 20 mg by mouth daily. 09/24/19   [provider]  simvastatin (ZOCOR) 80 MG tablet Take 80 mg by mouth at bedtime.    [provider]      Allergies    Patient has no known allergies.    Review of Systems   Review of Systems  HENT:  Negative for tinnitus.   Cardiovascular:  Negative for palpitations.  Gastrointestinal:  Negative for diarrhea, nausea and vomiting.  Neurological:  Positive for dizziness.  All other systems reviewed and are negative.   Physical Exam Updated Vital Signs BP 113/79   Pulse 61   Temp 98 F (36.7 C) (Oral)   Resp 17   Ht 5\' 10"  (1.778 m)   Wt 81.2 kg   SpO2 96%   BMI 25.69 kg/m  Physical Exam Vitals and nursing note reviewed.  Constitutional:      Appearance: He is well-developed.  HENT:     Head: Normocephalic.     Right Ear: External ear normal.     Left Ear: External ear normal.     Nose: Nose normal.     Mouth/Throat:     Mouth: Mucous membranes are moist.  Eyes:      Extraocular Movements: Extraocular movements intact.     Pupils: Pupils are equal, round, and reactive to light.  Cardiovascular:     Rate and Rhythm: Normal rate and regular rhythm.  Pulmonary:     Effort: Pulmonary effort is normal.  Abdominal:     General: Abdomen is flat. There is no distension.     Palpations: Abdomen is soft.  Musculoskeletal:        General: Normal range of motion.     Cervical back: Normal range of motion.  Skin:    General: Skin is warm.  Neurological:     General: No focal deficit present.     Mental Status: He is alert and oriented to person, place, and time.  Psychiatric:        Mood and Affect: Mood normal.     ED Results / Procedures / Treatments   Labs (all labs ordered are listed, but only abnormal results are displayed) Labs Reviewed  COMPREHENSIVE METABOLIC PANEL - Abnormal; Notable for the following components:      Result Value   Potassium 5.3 (*)    Glucose, Bld 119 (*)    All other components within normal limits  CBG MONITORING, ED - Abnormal; Notable for the following components:   Glucose-Capillary 116 (*)    All other components within normal limits  PROTIME-INR  APTT  CBC  DIFFERENTIAL  ETHANOL  I-STAT CHEM 8, ED    EKG None  Radiology MR BRAIN WO CONTRAST  Result Date: 02/12/2022 CLINICAL DATA:  Neuro deficit, acute, stroke suspected EXAM: MRI HEAD WITHOUT CONTRAST MRA HEAD WITHOUT CONTRAST MRA NECK WITHOUT AND WITH CONTRAST TECHNIQUE: Multiplanar, multi-echo pulse sequences of the brain and surrounding structures were acquired without intravenous contrast. Angiographic images of the Circle of Willis were acquired using MRA technique without intravenous contrast. Angiographic images of the neck were acquired using MRA technique without and with intravenous contrast. Carotid stenosis measurements (when applicable) are obtained utilizing NASCET criteria, using the distal internal carotid diameter as the denominator. CONTRAST:   19mL GADAVIST GADOBUTROL 1 MMOL/ML IV SOLN COMPARISON:  None Available. FINDINGS: MRI HEAD Brain: There is no acute infarction or intracranial hemorrhage. There is no intracranial mass, mass effect, or edema. There is no hydrocephalus or extra-axial fluid collection. Ventricles and sulci are normal in size and configuration. Patchy foci of T2 hyperintensity in the supratentorial white matter nonspecific but may reflect mild chronic microvascular ischemic changes. Vascular: Major vessel flow voids at the skull base are preserved. Skull and upper cervical spine: Normal marrow signal is preserved. Sinuses/Orbits: Paranasal sinuses are aerated. Orbits are unremarkable. Other: Sella is unremarkable.  Mastoid air cells are clear. MRA HEAD Intracranial internal  carotid arteries are patent. Middle and anterior cerebral arteries are patent. Intracranial vertebral arteries, basilar artery, posterior cerebral arteries are patent. There is no significant stenosis or aneurysm. MRA NECK Aortic arch is unremarkable. Great vessel origins are patent. Common, internal, and external carotid arteries are patent. Mild plaque at the ICA origins without hemodynamically significant stenosis. Vertebral arteries are patent and codominant without stenosis or evidence of dissection. IMPRESSION: No acute infarction, hemorrhage, or mass. Mild chronic microvascular ischemic changes. No large vessel occlusion, hemodynamically significant stenosis, or evidence of dissection. Electronically Signed   By: Guadlupe Spanish M.D.   On: 02/12/2022 14:14   MR ANGIO HEAD WO CONTRAST  Result Date: 02/12/2022 CLINICAL DATA:  Neuro deficit, acute, stroke suspected EXAM: MRI HEAD WITHOUT CONTRAST MRA HEAD WITHOUT CONTRAST MRA NECK WITHOUT AND WITH CONTRAST TECHNIQUE: Multiplanar, multi-echo pulse sequences of the brain and surrounding structures were acquired without intravenous contrast. Angiographic images of the Circle of Willis were acquired using MRA  technique without intravenous contrast. Angiographic images of the neck were acquired using MRA technique without and with intravenous contrast. Carotid stenosis measurements (when applicable) are obtained utilizing NASCET criteria, using the distal internal carotid diameter as the denominator. CONTRAST:  66mL GADAVIST GADOBUTROL 1 MMOL/ML IV SOLN COMPARISON:  None Available. FINDINGS: MRI HEAD Brain: There is no acute infarction or intracranial hemorrhage. There is no intracranial mass, mass effect, or edema. There is no hydrocephalus or extra-axial fluid collection. Ventricles and sulci are normal in size and configuration. Patchy foci of T2 hyperintensity in the supratentorial white matter nonspecific but may reflect mild chronic microvascular ischemic changes. Vascular: Major vessel flow voids at the skull base are preserved. Skull and upper cervical spine: Normal marrow signal is preserved. Sinuses/Orbits: Paranasal sinuses are aerated. Orbits are unremarkable. Other: Sella is unremarkable.  Mastoid air cells are clear. MRA HEAD Intracranial internal carotid arteries are patent. Middle and anterior cerebral arteries are patent. Intracranial vertebral arteries, basilar artery, posterior cerebral arteries are patent. There is no significant stenosis or aneurysm. MRA NECK Aortic arch is unremarkable. Great vessel origins are patent. Common, internal, and external carotid arteries are patent. Mild plaque at the ICA origins without hemodynamically significant stenosis. Vertebral arteries are patent and codominant without stenosis or evidence of dissection. IMPRESSION: No acute infarction, hemorrhage, or mass. Mild chronic microvascular ischemic changes. No large vessel occlusion, hemodynamically significant stenosis, or evidence of dissection. Electronically Signed   By: Guadlupe Spanish M.D.   On: 02/12/2022 14:14   MR ANGIO NECK W WO CONTRAST  Result Date: 02/12/2022 CLINICAL DATA:  Neuro deficit, acute, stroke  suspected EXAM: MRI HEAD WITHOUT CONTRAST MRA HEAD WITHOUT CONTRAST MRA NECK WITHOUT AND WITH CONTRAST TECHNIQUE: Multiplanar, multi-echo pulse sequences of the brain and surrounding structures were acquired without intravenous contrast. Angiographic images of the Circle of Willis were acquired using MRA technique without intravenous contrast. Angiographic images of the neck were acquired using MRA technique without and with intravenous contrast. Carotid stenosis measurements (when applicable) are obtained utilizing NASCET criteria, using the distal internal carotid diameter as the denominator. CONTRAST:  92mL GADAVIST GADOBUTROL 1 MMOL/ML IV SOLN COMPARISON:  None Available. FINDINGS: MRI HEAD Brain: There is no acute infarction or intracranial hemorrhage. There is no intracranial mass, mass effect, or edema. There is no hydrocephalus or extra-axial fluid collection. Ventricles and sulci are normal in size and configuration. Patchy foci of T2 hyperintensity in the supratentorial white matter nonspecific but may reflect mild chronic microvascular ischemic changes. Vascular: Major vessel flow  voids at the skull base are preserved. Skull and upper cervical spine: Normal marrow signal is preserved. Sinuses/Orbits: Paranasal sinuses are aerated. Orbits are unremarkable. Other: Sella is unremarkable.  Mastoid air cells are clear. MRA HEAD Intracranial internal carotid arteries are patent. Middle and anterior cerebral arteries are patent. Intracranial vertebral arteries, basilar artery, posterior cerebral arteries are patent. There is no significant stenosis or aneurysm. MRA NECK Aortic arch is unremarkable. Great vessel origins are patent. Common, internal, and external carotid arteries are patent. Mild plaque at the ICA origins without hemodynamically significant stenosis. Vertebral arteries are patent and codominant without stenosis or evidence of dissection. IMPRESSION: No acute infarction, hemorrhage, or mass. Mild  chronic microvascular ischemic changes. No large vessel occlusion, hemodynamically significant stenosis, or evidence of dissection. Electronically Signed   By: Guadlupe SpanishPraneil  Patel M.D.   On: 02/12/2022 14:14   CT HEAD CODE STROKE WO CONTRAST  Result Date: 02/12/2022 CLINICAL DATA:  Code stroke. EXAM: CT HEAD WITHOUT CONTRAST TECHNIQUE: Contiguous axial images were obtained from the base of the skull through the vertex without intravenous contrast. RADIATION DOSE REDUCTION: This exam was performed according to the departmental dose-optimization program which includes automated exposure control, adjustment of the mA and/or kV according to patient size and/or use of iterative reconstruction technique. COMPARISON:  None Available. FINDINGS: Brain: No acute intracranial hemorrhage, mass effect, or edema. Gray-white differentiation is preserved. Ventricles and sulci are normal in size and configuration. Minimal patchy low-density in the supratentorial white matter is nonspecific but could reflect minor chronic microvascular ischemic changes. There is no extra-axial collection. Vascular: No hyperdense vessel. Skull: Unremarkable. Sinuses/Orbits: No acute finding. Other: Mastoid air cells are clear. ASPECTS ([place] Stroke Program Early CT Score) - Ganglionic level infarction (caudate, lentiform nuclei, internal capsule, insula, M1-M3 cortex): 7 - Supraganglionic infarction (M4-M6 cortex): 3 Total score (0-10 with 10 being normal): 10 IMPRESSION: There is no acute intracranial hemorrhage or evidence of acute infarction. ASPECT score is 10. These results were communicated to Dr. Wilford CornerArora at 12:47 pm on 02/12/2022 by text page via the [facility] messaging system. Electronically Signed   By: Guadlupe SpanishPraneil  Patel M.D.   On: 02/12/2022 12:49    Procedures Procedures    Medications Ordered in ED Medications  sodium chloride flush (NS) 0.9 % injection 3 mL (0 mLs Intravenous Hold 02/12/22 1403)  gadobutrol (GADAVIST) 1 MMOL/ML injection  10 mL (10 mLs Intravenous Contrast Given 02/12/22 1358)    ED Course/ Medical Decision Making/ A&P                           Medical Decision Making Pt began feeling dizzy at home today   Amount and/or Complexity of Data Reviewed Independent Historian: spouse    Details: Pt here  with family External Data Reviewed: notes.    Details: Cardiology notes reviewed Labs: ordered. Decision-making details documented in ED Course.    Details: Labs ordered, reviewed and interpreted. Radiology: ordered and independent interpretation performed. Decision-making details documented in ED Course. ECG/medicine tests: ordered and independent interpretation performed.    Details: EKG normal sinus, normal ekg Discussion of management or test interpretation with external provider(s): Pt seen by Dr. Jerrell BelfastAurora Stroke neurology.     MDM:   Pt seen by Stroke Neurology.  Ct head is negative,  MRI Brain, angio brain and angio neck are normal   Dr. Jerrell BelfastAurora advised antivert at home and follow up with neurology  Final Clinical Impression(s) / ED Diagnoses Final diagnoses:  Vertigo    Rx / DC Orders ED Discharge Orders          Ordered    meclizine (ANTIVERT) 25 MG tablet  3 times daily PRN        02/12/22 1511          An After Visit Summary was printed and given to the patient.     Elson Areas, [place] 02/12/22 1511    Vanetta Mulders, MD 02/12/22 [phone]

## 2022-02-12 NOTE — ED Provider Triage Note (Signed)
Emergency Medicine Provider Triage Evaluation Note  Mark Melendez , a 61 y.o. male  was evaluated in triage.  Pt complains of dizziness and lightheadedness.  He reports that around 10 AM he was at work and had sudden onset dizziness.  It got worse when he stood up.  He felt off balance as well.  No history of CVA.  Has a history of stent placement x2.  Also history of diabetes, does not check his blood sugar  Review of Systems  Positive: Dizziness, lightheadedness, feeling off balance Negative: Weakness, slurred speech, facial droop, visual disturbance  Physical Exam  BP 110/78 (BP Location: Left Arm)   Pulse 65   Temp 98 F (36.7 C) (Oral)   Resp 18   SpO2 95%  Gen:   Awake, no distress   Resp:  Normal effort  MSK:   Moves extremities without difficulty  Other:  Patient unable to successfully perform finger-to-nose bilaterally.  Worse on the left side.  Also difficulty with heel-to-shin.  No nystagmus on EOMs.  Normal strength in bilateral upper and lower.  No visual field deficit.  Medical Decision Making  Medically screening exam initiated at 12:27 PM.  Appropriate orders placed.  Cyndia Diver was informed that the remainder of the evaluation will be completed by another provider, this initial triage assessment does not replace that evaluation, and the importance of remaining in the ED until their evaluation is complete.   Last known normal just prior to 10 AM.  Difficulty with finger-nose and heel shin.  Spoke with supervising physician MD Durwin Nora.  Will call code stroke.  Secretary, charge and nursing staff made aware.   Saddie Benders, PA-C 02/12/22 1229

## 2022-02-12 NOTE — ED Triage Notes (Signed)
Pt c/o dizziness and exertional  shob. Per Pt, dizziness would get worse with movement as well.  HX: cardiac stents, DM

## 2022-02-12 NOTE — Code Documentation (Signed)
Stroke Response Nurse Documentation Code Documentation  Mark Melendez is a 61 y.o. male arriving to [facility]  via [employer] on 02/12/22 with past medical hx of CAD, HTN. On aspirin 325 mg daily. Code stroke was activated by ED.   Patient from work where he was LKW at 1000 and now complaining of dizziness, gait instability. Pt was at work and noticed these acute changes and told his coworker who drove him to the hospital. Pt was triaged and EDP activated the code stroke.    Stroke team at the bedside on patient arrival to CT from ED room. NIHSS 4, see documentation for details and code stroke times. Patient with left arm weakness, left leg weakness, and bilateral limb ataxia on exam. The following imaging was completed:  CT Head and MRI. Patient is not a candidate for IV Thrombolytic due to no stroke on MRI per MD. Patient is not a candidate for IR due to no stroke per MD.   Care Plan: complete MRI/MRA, Q2 neuro checks until imaging completed and confirmed no vessel issues in the neck per MD. If negative then can discontinue neuro checks.   Bedside handoff with ED RN Luanna Salk.    Swayzee Wadley, [place]  Stroke Response RN

## 2022-02-12 NOTE — Consult Note (Addendum)
Neurology Consultation  Reason for Consult: code stroke Referring Physician: Deretha Emory  CC: Dizziness  History is obtained from: Patient   HPI: Mark Melendez is a 61 y.o. male with a PMH of CAD s/p stenting x4 and HTN. He was at work when he got up to use the restroom around 1000 and felt dizzy and noticed he was unsteady on his feet. A coworker drove him to the ED. On exam in triage he was unable to complete coordination exam accurately and a code stroke was activated.   Denies prior similar episodes.  Reports compliance to medications.  Sees a cardiologist regularly   LKW: 1000 IV thrombolysis given?: Yes Premorbid modified Rankin scale (mRS):  0-Completely asymptomatic and back to baseline post-stroke  ROS: Full ROS was performed and is negative except as noted in the HPI.  Past Medical History:  Diagnosis Date   Coronary artery disease    Hypertension     No family history on file.  Social History:   reports that he has never smoked. He does not have any smokeless tobacco history on file. He reports current alcohol use. He reports that he does not use drugs.  Medications  Current Facility-Administered Medications:    sodium chloride flush (NS) 0.9 % injection 3 mL, 3 mL, Intravenous, Once, [place], Madison A, PA-C  Current Outpatient Medications:    aspirin 325 MG EC tablet, Take 325 mg by mouth daily., Disp: , Rfl:    aspirin EC 81 MG EC tablet, Take 1 tablet (81 mg total) by mouth daily., Disp: 30 tablet, Rfl: 3   benzonatate (TESSALON PERLES) 100 MG capsule, Take 1 capsule (100 mg total) by mouth 3 (three) times daily as needed. (Patient not taking: Reported on 11/18/2019), Disp: 20 capsule, Rfl: 0   esomeprazole (NEXIUM) 40 MG capsule, Take 1 capsule (40 mg total) by mouth daily at 12 noon. (Patient not taking: Reported on 11/18/2019), Disp: 30 capsule, Rfl: 3   ezetimibe (ZETIA) 10 MG tablet, Take 10 mg by mouth daily., Disp: , Rfl:    fish oil-omega-3 fatty acids  1000 MG capsule, Take 1 g by mouth daily., Disp: , Rfl:    fluticasone (FLONASE ALLERGY RELIEF) 50 MCG/ACT nasal spray, 1 spray in each nostril, Disp: , Rfl:    JARDIANCE 25 MG TABS tablet, Take 25 mg by mouth daily., Disp: , Rfl:    lisinopril (ZESTRIL) 20 MG tablet, Take 20 mg by mouth every evening. , Disp: , Rfl:    lisinopril (ZESTRIL) 20 MG tablet, 1 tablet, Disp: , Rfl:    metFORMIN (GLUCOPHAGE-XR) 500 MG 24 hr tablet, Take 2,500 mg by mouth See admin instructions. 1000mg  in the morning, and 500mg  at bedtime., Disp: , Rfl:    metoprolol succinate (TOPROL-XL) 25 MG 24 hr tablet, Take 25 mg by mouth daily., Disp: , Rfl:    Multiple Vitamins-Minerals (MULTIVITAMIN PO), Take 1 tablet by mouth daily., Disp: , Rfl:    nitroGLYCERIN (NITROSTAT) 0.4 MG SL tablet, Place 1 tablet (0.4 mg total) under the tongue every 5 (five) minutes x 3 doses as needed for chest pain., Disp: 25 tablet, Rfl: 12   rosuvastatin (CRESTOR) 20 MG tablet, Take 20 mg by mouth daily., Disp: , Rfl:    simvastatin (ZOCOR) 80 MG tablet, Take 80 mg by mouth at bedtime., Disp: , Rfl:   Exam: Current vital signs: BP 110/78 (BP Location: Left Arm)   Pulse 65   Temp 98 F (36.7 C) (Oral)   Resp  18   Ht 5\' 10"  (1.778 m)   Wt 81.2 kg   SpO2 95%   BMI 25.69 kg/m  Vital signs in last 24 hours: Temp:  [98 F (36.7 C)] 98 F (36.7 C) (07/10 1139) Pulse Rate:  [65] 65 (07/10 1139) Resp:  [18] 18 (07/10 1139) BP: (110)/(78) 110/78 (07/10 1139) SpO2:  [95 %] 95 % (07/10 1139) Weight:  [81.2 kg] 81.2 kg (07/10 1209)  GENERAL: Awake, alert in NAD HEENT: - Normocephalic and atraumatic, dry mm, no LN++, no Thyromegally LUNGS - Clear to auscultation bilaterally with no wheezes CV - S1S2 RRR, no m/r/g, equal pulses bilaterally. ABDOMEN - Soft, nontender, nondistended with normoactive BS Ext: warm, well perfused, intact peripheral pulses,no edema  NEURO:  Mental Status: AA&Ox3  Language: speech is clear.  Naming, repetition,  fluency, and comprehension intact. Cranial Nerves: PERRL, EOMI , visual fields full , no facial asymmetry, facial sensation intact, hearing intact, tongue/uvula/soft palate midline, normal sternocleidomastoid and trapezius muscle strength. No evidence of tongue atrophy or fibrillations Motor: drift noted in left upper and lower extremity Tone: is normal and bulk is normal Sensation- Intact to light touch bilaterally Coordination: FTN bilateral ataxia Gait- staggering unsteady gait  NIHSS 1a Level of Conscious.: 0 1b LOC Questions: 0 1c LOC Commands: 0 2 Best Gaze: 0 3 Visual: 0 4 Facial Palsy: 0 5a Motor Arm - left: 1 5b Motor Arm - Right: 0 6a Motor Leg - Left: 1 6b Motor Leg - Right: 0 7 Limb Ataxia: 2 (bilateral upper extremities) 8 Sensory: 0 9 Best Language: 0 10 Dysarthria: 0 11 Extinct. and Inatten.: 0 TOTAL: 4   Labs I have reviewed labs in epic and the results pertinent to this consultation are:  CBC    Component Value Date/Time   WBC 9.5 11/18/2019 1546   RBC 4.93 11/18/2019 1546   HGB 14.2 11/18/2019 1546   HCT 42.6 11/18/2019 1546   PLT 242 11/18/2019 1546   MCV 86.4 11/18/2019 1546   MCH 28.8 11/18/2019 1546   MCHC 33.3 11/18/2019 1546   RDW 13.9 11/18/2019 1546   LYMPHSABS 2.4 11/18/2019 1546   MONOABS 0.8 11/18/2019 1546   EOSABS 0.1 11/18/2019 1546   BASOSABS 0.0 11/18/2019 1546    CMP     Component Value Date/Time   NA 139 11/18/2019 1546   K 4.2 11/18/2019 1546   CL 103 11/18/2019 1546   CO2 24 11/18/2019 1546   GLUCOSE 187 (H) 11/18/2019 1546   BUN 17 11/18/2019 1546   CREATININE 1.18 11/18/2019 1546   CALCIUM 9.8 11/18/2019 1546   PROT 6.0 (L) 11/18/2019 1546   ALBUMIN 3.5 11/18/2019 1546   AST 21 11/18/2019 1546   ALT 29 11/18/2019 1546   ALKPHOS 49 11/18/2019 1546   BILITOT 0.4 11/18/2019 1546   GFRNONAA >60 11/18/2019 1546   GFRAA >60 11/18/2019 1546    Lipid Panel     Component Value Date/Time   CHOL 162 03/14/2015 0359    TRIG 270 (H) 03/14/2015 0359   HDL 27 (L) 03/14/2015 0359   CHOLHDL 6.0 03/14/2015 0359   VLDL 54 (H) 03/14/2015 0359   LDLCALC 81 03/14/2015 0359     Imaging I have reviewed the images obtained:  CT-head- no acute abnormality  Assessment: 61 year old male with a PMH of CAD s/p stenting x4 and HTN presenting with dizziness.  Differentials include posterior circulation stroke versus vestibular disturbance. Given his risk factors, stroke remains high on differential.  Discussed risk-benefit of IV TNKase as well as possibility of obtaining an MRI first to confirm the diagnosis.  He agreed to first get the MRI. Taken for stat MRI which was negative for acute stroke.  MRA head and neck also negative for emergent process  Impression: Most likely peripheral vertigo  Recommendations Trial of meclizine Trial of diazepam Vestibular rehab Outpatient neurology follow-up No need for inpatient stroke work-up Plan relayed to Dr. Deretha Emory  Patient seen and examined by NP/APP with MD. MD to update note as needed.   Elmer Picker, DNP, FNP-BC [facility] Pager: [phone]  Attending Neurohospitalist Addendum Patient seen and examined with APP/Resident. Agree with the history and physical as documented above. Agree with the plan as documented, which I helped formulate. I have independently reviewed the chart, obtained history, review of systems and examined the patient.I have personally reviewed pertinent head/neck/spine imaging (CT/MRI). MRI brain, MRA head, MRA neck-negative for acute process.  No evidence of stroke or dissection.  Plan personally discussed with Dr. Deretha Emory. Please feel free to call with any questions.  -- Milon Dikes, MD Neurologist [facility] Pager: [phone]

## 2022-02-27 DIAGNOSIS — M5416 Radiculopathy, lumbar region: Secondary | ICD-10-CM | POA: Diagnosis not present

## 2022-03-07 DIAGNOSIS — M5416 Radiculopathy, lumbar region: Secondary | ICD-10-CM | POA: Diagnosis not present

## 2022-04-30 DIAGNOSIS — M5416 Radiculopathy, lumbar region: Secondary | ICD-10-CM | POA: Diagnosis not present

## 2022-05-11 DIAGNOSIS — I25118 Atherosclerotic heart disease of native coronary artery with other forms of angina pectoris: Secondary | ICD-10-CM | POA: Diagnosis not present

## 2022-05-11 DIAGNOSIS — E785 Hyperlipidemia, unspecified: Secondary | ICD-10-CM | POA: Diagnosis not present

## 2022-05-11 DIAGNOSIS — I10 Essential (primary) hypertension: Secondary | ICD-10-CM | POA: Diagnosis not present

## 2022-05-11 DIAGNOSIS — E119 Type 2 diabetes mellitus without complications: Secondary | ICD-10-CM | POA: Diagnosis not present

## 2022-06-12 DIAGNOSIS — E119 Type 2 diabetes mellitus without complications: Secondary | ICD-10-CM | POA: Diagnosis not present

## 2022-08-16 DIAGNOSIS — M25531 Pain in right wrist: Secondary | ICD-10-CM | POA: Diagnosis not present

## 2022-08-16 DIAGNOSIS — I10 Essential (primary) hypertension: Secondary | ICD-10-CM | POA: Diagnosis not present

## 2022-08-16 DIAGNOSIS — E1169 Type 2 diabetes mellitus with other specified complication: Secondary | ICD-10-CM | POA: Diagnosis not present

## 2022-08-21 DIAGNOSIS — R5383 Other fatigue: Secondary | ICD-10-CM | POA: Diagnosis not present

## 2022-08-21 DIAGNOSIS — R051 Acute cough: Secondary | ICD-10-CM | POA: Diagnosis not present

## 2022-08-21 DIAGNOSIS — R52 Pain, unspecified: Secondary | ICD-10-CM | POA: Diagnosis not present

## 2022-08-21 DIAGNOSIS — J069 Acute upper respiratory infection, unspecified: Secondary | ICD-10-CM | POA: Diagnosis not present

## 2022-08-21 DIAGNOSIS — B974 Respiratory syncytial virus as the cause of diseases classified elsewhere: Secondary | ICD-10-CM | POA: Diagnosis not present

## 2022-08-21 DIAGNOSIS — J029 Acute pharyngitis, unspecified: Secondary | ICD-10-CM | POA: Diagnosis not present

## 2022-08-21 DIAGNOSIS — Z03818 Encounter for observation for suspected exposure to other biological agents ruled out: Secondary | ICD-10-CM | POA: Diagnosis not present

## 2022-08-30 DIAGNOSIS — M654 Radial styloid tenosynovitis [de Quervain]: Secondary | ICD-10-CM | POA: Diagnosis not present

## 2022-09-06 DIAGNOSIS — E1169 Type 2 diabetes mellitus with other specified complication: Secondary | ICD-10-CM | POA: Diagnosis not present

## 2022-09-06 DIAGNOSIS — Z0000 Encounter for general adult medical examination without abnormal findings: Secondary | ICD-10-CM | POA: Diagnosis not present

## 2022-09-06 DIAGNOSIS — E785 Hyperlipidemia, unspecified: Secondary | ICD-10-CM | POA: Diagnosis not present

## 2022-09-06 DIAGNOSIS — I10 Essential (primary) hypertension: Secondary | ICD-10-CM | POA: Diagnosis not present

## 2022-09-07 DIAGNOSIS — E785 Hyperlipidemia, unspecified: Secondary | ICD-10-CM | POA: Diagnosis not present

## 2022-09-07 DIAGNOSIS — E1169 Type 2 diabetes mellitus with other specified complication: Secondary | ICD-10-CM | POA: Diagnosis not present

## 2022-09-07 DIAGNOSIS — Z0000 Encounter for general adult medical examination without abnormal findings: Secondary | ICD-10-CM | POA: Diagnosis not present

## 2022-09-07 DIAGNOSIS — I10 Essential (primary) hypertension: Secondary | ICD-10-CM | POA: Diagnosis not present

## 2022-09-27 DIAGNOSIS — M654 Radial styloid tenosynovitis [de Quervain]: Secondary | ICD-10-CM | POA: Diagnosis not present

## 2022-11-08 DIAGNOSIS — H00012 Hordeolum externum right lower eyelid: Secondary | ICD-10-CM | POA: Diagnosis not present

## 2022-11-08 DIAGNOSIS — H1031 Unspecified acute conjunctivitis, right eye: Secondary | ICD-10-CM | POA: Diagnosis not present

## 2023-03-12 DIAGNOSIS — M25462 Effusion, left knee: Secondary | ICD-10-CM | POA: Diagnosis not present

## 2023-03-12 DIAGNOSIS — G8929 Other chronic pain: Secondary | ICD-10-CM | POA: Diagnosis not present

## 2023-03-12 DIAGNOSIS — E1169 Type 2 diabetes mellitus with other specified complication: Secondary | ICD-10-CM | POA: Diagnosis not present

## 2023-03-12 DIAGNOSIS — M25562 Pain in left knee: Secondary | ICD-10-CM | POA: Diagnosis not present

## 2023-03-12 DIAGNOSIS — R351 Nocturia: Secondary | ICD-10-CM | POA: Diagnosis not present

## 2023-03-12 DIAGNOSIS — I10 Essential (primary) hypertension: Secondary | ICD-10-CM | POA: Diagnosis not present

## 2023-03-12 DIAGNOSIS — M1712 Unilateral primary osteoarthritis, left knee: Secondary | ICD-10-CM | POA: Diagnosis not present

## 2023-05-29 DIAGNOSIS — E119 Type 2 diabetes mellitus without complications: Secondary | ICD-10-CM | POA: Diagnosis not present

## 2023-05-29 DIAGNOSIS — E782 Mixed hyperlipidemia: Secondary | ICD-10-CM | POA: Diagnosis not present

## 2023-05-29 DIAGNOSIS — I25118 Atherosclerotic heart disease of native coronary artery with other forms of angina pectoris: Secondary | ICD-10-CM | POA: Diagnosis not present

## 2023-05-29 DIAGNOSIS — I10 Essential (primary) hypertension: Secondary | ICD-10-CM | POA: Diagnosis not present

## 2023-06-14 DIAGNOSIS — E119 Type 2 diabetes mellitus without complications: Secondary | ICD-10-CM | POA: Diagnosis not present

## 2023-07-28 DIAGNOSIS — M25552 Pain in left hip: Secondary | ICD-10-CM | POA: Diagnosis not present

## 2023-07-28 DIAGNOSIS — M7072 Other bursitis of hip, left hip: Secondary | ICD-10-CM | POA: Diagnosis not present

## 2023-08-01 DIAGNOSIS — M7062 Trochanteric bursitis, left hip: Secondary | ICD-10-CM | POA: Diagnosis not present

## 2023-08-01 DIAGNOSIS — M25552 Pain in left hip: Secondary | ICD-10-CM | POA: Diagnosis not present

## 2023-08-28 DIAGNOSIS — E782 Mixed hyperlipidemia: Secondary | ICD-10-CM | POA: Diagnosis not present

## 2023-08-28 DIAGNOSIS — I25118 Atherosclerotic heart disease of native coronary artery with other forms of angina pectoris: Secondary | ICD-10-CM | POA: Diagnosis not present

## 2023-08-28 DIAGNOSIS — E119 Type 2 diabetes mellitus without complications: Secondary | ICD-10-CM | POA: Diagnosis not present

## 2023-08-28 DIAGNOSIS — I10 Essential (primary) hypertension: Secondary | ICD-10-CM | POA: Diagnosis not present

## 2023-09-05 DIAGNOSIS — J020 Streptococcal pharyngitis: Secondary | ICD-10-CM | POA: Diagnosis not present

## 2023-09-05 DIAGNOSIS — U071 COVID-19: Secondary | ICD-10-CM | POA: Diagnosis not present

## 2023-09-10 DIAGNOSIS — U071 COVID-19: Secondary | ICD-10-CM | POA: Diagnosis not present

## 2023-10-18 DIAGNOSIS — Z125 Encounter for screening for malignant neoplasm of prostate: Secondary | ICD-10-CM | POA: Diagnosis not present

## 2023-10-18 DIAGNOSIS — J309 Allergic rhinitis, unspecified: Secondary | ICD-10-CM | POA: Diagnosis not present

## 2023-10-18 DIAGNOSIS — I2510 Atherosclerotic heart disease of native coronary artery without angina pectoris: Secondary | ICD-10-CM | POA: Diagnosis not present

## 2023-10-18 DIAGNOSIS — E785 Hyperlipidemia, unspecified: Secondary | ICD-10-CM | POA: Diagnosis not present

## 2023-10-18 DIAGNOSIS — I10 Essential (primary) hypertension: Secondary | ICD-10-CM | POA: Diagnosis not present

## 2023-10-18 DIAGNOSIS — Z0000 Encounter for general adult medical examination without abnormal findings: Secondary | ICD-10-CM | POA: Diagnosis not present

## 2023-10-18 DIAGNOSIS — E1169 Type 2 diabetes mellitus with other specified complication: Secondary | ICD-10-CM | POA: Diagnosis not present

## 2023-11-27 DIAGNOSIS — R051 Acute cough: Secondary | ICD-10-CM | POA: Diagnosis not present

## 2023-11-27 DIAGNOSIS — R42 Dizziness and giddiness: Secondary | ICD-10-CM | POA: Diagnosis not present

## 2023-11-27 DIAGNOSIS — R197 Diarrhea, unspecified: Secondary | ICD-10-CM | POA: Diagnosis not present

## 2023-11-27 DIAGNOSIS — R0602 Shortness of breath: Secondary | ICD-10-CM | POA: Diagnosis not present

## 2023-12-22 ENCOUNTER — Other Ambulatory Visit: Payer: Self-pay

## 2023-12-22 ENCOUNTER — Encounter (HOSPITAL_COMMUNITY): Payer: Self-pay | Admitting: *Deleted

## 2023-12-22 ENCOUNTER — Emergency Department (HOSPITAL_COMMUNITY)

## 2023-12-22 ENCOUNTER — Observation Stay (HOSPITAL_COMMUNITY)
Admission: EM | Admit: 2023-12-22 | Discharge: 2023-12-23 | Disposition: A | Discharge status: Home / Self Care | Attending: Obstetrics and Gynecology | Admitting: Obstetrics and Gynecology

## 2023-12-22 DIAGNOSIS — I2510 Atherosclerotic heart disease of native coronary artery without angina pectoris: Secondary | ICD-10-CM | POA: Diagnosis present

## 2023-12-22 DIAGNOSIS — Z955 Presence of coronary angioplasty implant and graft: Secondary | ICD-10-CM | POA: Diagnosis not present

## 2023-12-22 DIAGNOSIS — E119 Type 2 diabetes mellitus without complications: Secondary | ICD-10-CM

## 2023-12-22 DIAGNOSIS — Z79899 Other long term (current) drug therapy: Secondary | ICD-10-CM | POA: Diagnosis not present

## 2023-12-22 DIAGNOSIS — I10 Essential (primary) hypertension: Secondary | ICD-10-CM | POA: Diagnosis not present

## 2023-12-22 DIAGNOSIS — R42 Dizziness and giddiness: Secondary | ICD-10-CM | POA: Diagnosis not present

## 2023-12-22 DIAGNOSIS — E785 Hyperlipidemia, unspecified: Secondary | ICD-10-CM | POA: Diagnosis not present

## 2023-12-22 DIAGNOSIS — Z7984 Long term (current) use of oral hypoglycemic drugs: Secondary | ICD-10-CM | POA: Diagnosis not present

## 2023-12-22 DIAGNOSIS — Z7982 Long term (current) use of aspirin: Secondary | ICD-10-CM | POA: Insufficient documentation

## 2023-12-22 DIAGNOSIS — R55 Syncope and collapse: Secondary | ICD-10-CM | POA: Diagnosis not present

## 2023-12-22 DIAGNOSIS — K219 Gastro-esophageal reflux disease without esophagitis: Secondary | ICD-10-CM | POA: Diagnosis not present

## 2023-12-22 LAB — CBC
HCT: 48.7 % (ref 39.0–52.0)
HCT: 46.4 % (ref 39.0–52.0)
Hemoglobin: 16.5 g/dL (ref 13.0–17.0)
Hemoglobin: 15.4 g/dL (ref 13.0–17.0)
MCH: 29.7 pg (ref 26.0–34.0)
MCH: 29.3 pg (ref 26.0–34.0)
MCHC: 33.9 g/dL (ref 30.0–36.0)
MCHC: 33.2 g/dL (ref 30.0–36.0)
MCV: 87.7 fL (ref 80.0–100.0)
MCV: 88.2 fL (ref 80.0–100.0)
Platelets: 261 10*3/uL (ref 150–400)
Platelets: 243 10*3/uL (ref 150–400)
RBC: 5.55 MIL/uL (ref 4.22–5.81)
RBC: 5.26 MIL/uL (ref 4.22–5.81)
RDW: 13.2 % (ref 11.5–15.5)
RDW: 13.2 % (ref 11.5–15.5)
WBC: 7.8 10*3/uL (ref 4.0–10.5)
WBC: 6.6 10*3/uL (ref 4.0–10.5)
nRBC: 0 % (ref 0.0–0.2)
nRBC: 0 % (ref 0.0–0.2)

## 2023-12-22 LAB — TROPONIN I (HIGH SENSITIVITY)
Troponin I (High Sensitivity): 45 ng/L — ABNORMAL HIGH (ref <18)
Troponin I (High Sensitivity): 38 ng/L — ABNORMAL HIGH (ref <18)

## 2023-12-22 LAB — URINALYSIS, ROUTINE W REFLEX MICROSCOPIC
Bacteria, UA: NONE SEEN
Bilirubin Urine: NEGATIVE
Glucose, UA: >=500 mg/dL — AB
Hgb urine dipstick: NEGATIVE
Ketones, ur: NEGATIVE mg/dL
Leukocytes,Ua: NEGATIVE
Nitrite: NEGATIVE
Protein, ur: NEGATIVE mg/dL
Specific Gravity, Urine: 1.021 (ref 1.005–1.030)
pH: 5 (ref 5.0–8.0)

## 2023-12-22 LAB — HEMOGLOBIN A1C
Hgb A1c MFr Bld: 7.3 % — ABNORMAL HIGH (ref 4.8–5.6)
Hgb A1c MFr Bld: 7.4 % — ABNORMAL HIGH (ref 4.8–5.6)
Mean Plasma Glucose: 162.81 mg/dL
Mean Plasma Glucose: 165.68 mg/dL

## 2023-12-22 LAB — COMPREHENSIVE METABOLIC PANEL WITH GFR
ALT: 21 U/L (ref 0–44)
AST: 29 U/L (ref 15–41)
Albumin: 3.9 g/dL (ref 3.5–5.0)
Alkaline Phosphatase: 48 U/L (ref 38–126)
Anion gap: 11 (ref 5–15)
BUN: 16 mg/dL (ref 8–23)
CO2: 24 mmol/L (ref 22–32)
Calcium: 9.7 mg/dL (ref 8.9–10.3)
Chloride: 105 mmol/L (ref 98–111)
Creatinine, Ser: 0.99 mg/dL (ref 0.61–1.24)
GFR, Estimated: >60 mL/min (ref >60)
Glucose, Bld: 139 mg/dL — ABNORMAL HIGH (ref 70–99)
Potassium: 4.4 mmol/L (ref 3.5–5.1)
Sodium: 140 mmol/L (ref 135–145)
Total Bilirubin: 0.6 mg/dL (ref 0.0–1.2)
Total Protein: 6.6 g/dL (ref 6.5–8.1)

## 2023-12-22 LAB — TSH: TSH: 1.735 u[IU]/mL (ref 0.350–4.500)

## 2023-12-22 LAB — CBG MONITORING, ED
Glucose-Capillary: 134 mg/dL — ABNORMAL HIGH (ref 70–99)
Glucose-Capillary: 186 mg/dL — ABNORMAL HIGH (ref 70–99)

## 2023-12-22 LAB — CREATININE, SERUM
Creatinine, Ser: 1 mg/dL (ref 0.61–1.24)
GFR, Estimated: >60 mL/min (ref >60)

## 2023-12-22 LAB — HIV ANTIBODY (ROUTINE TESTING W REFLEX): HIV Screen 4th Generation wRfx: NONREACTIVE

## 2023-12-22 MED ORDER — ONDANSETRON HCL 4 MG PO TABS
4.0000 mg | ORAL_TABLET | Freq: Four times a day (QID) | ORAL | Status: DC | PRN
Start: 2023-12-22 — End: 2023-12-23

## 2023-12-22 MED ORDER — EZETIMIBE 10 MG PO TABS
10.0000 mg | ORAL_TABLET | Freq: Every day | ORAL | Status: DC
  Administered 2023-12-23: 10 mg via ORAL
  Filled 2023-12-22: qty 1

## 2023-12-22 MED ORDER — INSULIN ASPART 100 UNIT/ML IJ SOLN
0.0000 [IU] | Freq: Three times a day (TID) | INTRAMUSCULAR | Status: DC
  Administered 2023-12-23: 3 [IU] via SUBCUTANEOUS

## 2023-12-22 MED ORDER — LISINOPRIL 20 MG PO TABS
20.0000 mg | ORAL_TABLET | Freq: Every day | ORAL | Status: DC
  Administered 2023-12-23: 20 mg via ORAL
  Filled 2023-12-22: qty 1

## 2023-12-22 MED ORDER — TAMSULOSIN HCL 0.4 MG PO CAPS
0.4000 mg | ORAL_CAPSULE | Freq: Every day | ORAL | Status: DC
  Administered 2023-12-23: 0.4 mg via ORAL
  Filled 2023-12-22: qty 1

## 2023-12-22 MED ORDER — INSULIN ASPART 100 UNIT/ML IJ SOLN: 0.0000 [IU] | Freq: Every day | INTRAMUSCULAR | Status: DC

## 2023-12-22 MED ORDER — ENOXAPARIN SODIUM 40 MG/0.4ML IJ SOSY
40.0000 mg | PREFILLED_SYRINGE | INTRAMUSCULAR | Status: DC
  Administered 2023-12-22: 40 mg via SUBCUTANEOUS
  Filled 2023-12-22: qty 0.4

## 2023-12-22 MED ORDER — LACTATED RINGERS IV SOLN: INTRAVENOUS | Status: DC

## 2023-12-22 MED ORDER — ROSUVASTATIN CALCIUM 20 MG PO TABS
20.0000 mg | ORAL_TABLET | Freq: Every day | ORAL | Status: DC
  Administered 2023-12-23: 20 mg via ORAL
  Filled 2023-12-22: qty 1

## 2023-12-22 MED ORDER — NITROGLYCERIN 0.4 MG SL SUBL
0.4000 mg | SUBLINGUAL_TABLET | SUBLINGUAL | Status: DC | PRN
Start: 2023-12-22 — End: 2023-12-23

## 2023-12-22 MED ORDER — ONDANSETRON HCL 4 MG/2ML IJ SOLN: 4.0000 mg | Freq: Four times a day (QID) | INTRAMUSCULAR | Status: DC | PRN

## 2023-12-22 MED ORDER — SODIUM CHLORIDE 0.9% FLUSH
3.0000 mL | Freq: Two times a day (BID) | INTRAVENOUS | Status: DC
  Administered 2023-12-22 – 2023-12-23 (×2): 3 mL via INTRAVENOUS

## 2023-12-22 MED ORDER — METOPROLOL SUCCINATE ER 25 MG PO TB24
25.0000 mg | ORAL_TABLET | Freq: Every day | ORAL | Status: DC
  Administered 2023-12-23: 25 mg via ORAL
  Filled 2023-12-22: qty 1

## 2023-12-22 NOTE — ED Provider Notes (Signed)
 [place] EMERGENCY DEPARTMENT AT [facility] Provider Note   CSN: [id number] Arrival date & time: 12/22/23  1227     History  Chief Complaint  Patient presents with   Near Syncope    Mark Melendez is a 63 y.o. male past medical history significant for hypertension, previous coronary stent placement, who presents with concern for syncope/near syncope while at church this morning.  Patient reports that he was walking to the back of the church, began feeling lightheaded and slightly short of breath, someone helped him to the floor and placed him in Trendelenburg.  He reports he did not take his blood pressure and other medications this morning.  He denies any numbness, tingling, facial droop, chest pain prior to syncope.  He reports that his shortness of breath is overall resolved.  He reports that he had 1 similar episode a few years ago and has not had any intermittent episodes since then.  He does report that he had a piece of toast and some coffee this morning but nothing after that.   Near Syncope       Home Medications Prior to Admission medications   Medication Sig Start Date End Date Taking? Authorizing Provider  aspirin  EC 81 MG EC tablet Take 1 tablet (81 mg total) by mouth daily. 03/15/15   Chapman Commodore, MD  aspirin  EC 81 MG tablet Take 324 mg by mouth every evening. Swallow whole.    [provider]  benzonatate  (TESSALON  PERLES) 100 MG capsule Take 1 capsule (100 mg total) by mouth 3 (three) times daily as needed. Patient not taking: Reported on 11/18/2019 05/25/19   Yevette Hem, FNP  esomeprazole  (NEXIUM ) 40 MG capsule Take 1 capsule (40 mg total) by mouth daily at 12 noon. Patient not taking: Reported on 11/18/2019 03/14/15   Chapman Commodore, MD  ezetimibe  (ZETIA ) 10 MG tablet Take 10 mg by mouth daily. 10/02/19   [provider]  fish oil-omega-3 fatty acids 1000 MG capsule Take 1 g by mouth every evening.    [provider]   fluticasone (FLONASE ALLERGY RELIEF) 50 MCG/ACT nasal spray Place 2 sprays into both nostrils daily. 09/06/14   [provider]  JARDIANCE 25 MG TABS tablet Take 25 mg by mouth daily. 10/02/21   [provider]  lisinopril  (ZESTRIL ) 20 MG tablet Take 20 mg by mouth daily. 10/02/19   [provider]  meclizine  (ANTIVERT ) 25 MG tablet Take 1 tablet (25 mg total) by mouth 3 (three) times daily as needed for dizziness. 02/12/22   Sofia, Leslie K, PA-C  metFORMIN (GLUCOPHAGE-XR) 500 MG 24 hr tablet Take 1,000 mg by mouth in the morning and at bedtime. 09/21/19   [provider]  metoprolol  succinate (TOPROL -XL) 25 MG 24 hr tablet Take 25 mg by mouth daily.    [provider]  Multiple Vitamins-Minerals (MULTIVITAMIN PO) Take 1 tablet by mouth daily.    [provider]  nitroGLYCERIN  (NITROSTAT ) 0.4 MG SL tablet Place 1 tablet (0.4 mg total) under the tongue every 5 (five) minutes x 3 doses as needed for chest pain. 03/14/15   Chapman Commodore, MD  rosuvastatin  (CRESTOR ) 20 MG tablet Take 20 mg by mouth daily. 09/24/19   [provider]  tamsulosin  (FLOMAX ) 0.4 MG CAPS capsule Take 0.4 mg by mouth daily. 12/10/21   [provider]      Allergies    Patient has no known allergies.    Review of Systems  Review of Systems  Cardiovascular:  Positive for near-syncope.  All other systems reviewed and are negative.   Physical Exam Updated Vital Signs BP 121/80   Pulse 76   Temp 98.4 F (36.9 C) (Oral)   Resp (!) 22   Ht 5\' 10"  (1.778 m)   Wt 81.2 kg   SpO2 94%   BMI 25.69 kg/m  Physical Exam Vitals and nursing note reviewed.  Constitutional:      General: He is not in acute distress.    Appearance: Normal appearance.  HENT:     Head: Normocephalic and atraumatic.  Eyes:     General:        Right eye: No discharge.        Left eye: No discharge.  Cardiovascular:     Rate and Rhythm: Normal rate and regular rhythm.     Heart  sounds: No murmur heard.    No friction rub. No gallop.  Pulmonary:     Effort: Pulmonary effort is normal.     Breath sounds: Normal breath sounds.  Abdominal:     General: Bowel sounds are normal.     Palpations: Abdomen is soft.  Skin:    General: Skin is warm and dry.     Capillary Refill: Capillary refill takes less than 2 seconds.  Neurological:     Mental Status: He is alert and oriented to person, place, and time.     Comments: Cranial nerves II through XII grossly intact.  Intact finger-nose, intact heel-to-shin.  Romberg negative, gait normal.  Alert and oriented x3.  Moves all 4 limbs spontaneously, normal coordination.  No pronator drift.  Intact strength 5 out of 5 bilateral upper and lower extremities.  Psychiatric:        Mood and Affect: Mood normal.        Behavior: Behavior normal.     ED Results / Procedures / Treatments   Labs (all labs ordered are listed, but only abnormal results are displayed) Labs Reviewed  COMPREHENSIVE METABOLIC PANEL WITH GFR - Abnormal; Notable for the following components:      Result Value   Glucose, Bld 139 (*)    All other components within normal limits  URINALYSIS, ROUTINE W REFLEX MICROSCOPIC - Abnormal; Notable for the following components:   Glucose, UA >=500 (*)    All other components within normal limits  CBG MONITORING, ED - Abnormal; Notable for the following components:   Glucose-Capillary 134 (*)    All other components within normal limits  TROPONIN I (HIGH SENSITIVITY) - Abnormal; Notable for the following components:   Troponin I (High Sensitivity) 45 (*)    All other components within normal limits  TROPONIN I (HIGH SENSITIVITY) - Abnormal; Notable for the following components:   Troponin I (High Sensitivity) 38 (*)    All other components within normal limits  CBC    EKG EKG Interpretation Date/Time:  Sunday Dec 22 2023 12:43:42 EDT Ventricular Rate:  105 PR Interval:  264 QRS Duration:  86 QT  Interval:  344 QTC Calculation: 454 R Axis:   90  Text Interpretation: Sinus tachycardia with 1st degree A-V block with occasional Premature ventricular complexes Rightward axis T wave abnormality, consider inferior ischemia Abnormal ECG When compared with ECG of 12-Feb-2022 14:02, PREVIOUS ECG IS PRESENT Confirmed by Afton Horse [phone]) on 12/22/2023 4:16:32 PM  Radiology CT Head Wo Contrast Result Date: 12/22/2023 CLINICAL DATA:  Syncope/presyncope, cerebrovascular cause suspected EXAM: CT HEAD WITHOUT CONTRAST TECHNIQUE: Contiguous  axial images were obtained from the base of the skull through the vertex without intravenous contrast. RADIATION DOSE REDUCTION: This exam was performed according to the departmental dose-optimization program which includes automated exposure control, adjustment of the mA and/or kV according to patient size and/or use of iterative reconstruction technique. COMPARISON:  Head CT 02/12/2022 FINDINGS: Brain: No intracranial hemorrhage, mass effect, or midline shift. No hydrocephalus. The basilar cisterns are patent. No evidence of territorial infarct or acute ischemia. No extra-axial or intracranial fluid collection. Vascular: No hyperdense vessel or unexpected calcification. Skull: No fracture or focal lesion. Sinuses/Orbits: Paranasal sinuses and mastoid air cells are clear. The visualized orbits are unremarkable. Other: None. IMPRESSION: No acute intracranial abnormality. Electronically Signed   By: Chadwick Colonel M.D.   On: 12/22/2023 16:28    Procedures Procedures    Medications Ordered in ED Medications - No data to display  ED Course/ Medical Decision Making/ A&P                                 Medical Decision Making Amount and/or Complexity of Data Reviewed Labs: ordered.  Risk Decision regarding hospitalization.   This patient is a 63 y.o. male  who presents to the ED for concern of syncope / near syncope.   Differential diagnoses prior to  evaluation: The emergent differential diagnosis includes, but is not limited to,  CVA, ACS, arrhythmia, vasovagal / orthostatic hypotension, sepsis, hypoglycemia, electrolyte disturbance, respiratory failure, anemia, dehydration, heat injury, polypharmacy, malignancy, anxiety/panic attack . This is not an exhaustive differential.   Past Medical History / Co-morbidities / Social History: hypertension, previous coronary stent placement  Additional history: Chart reviewed. Pertinent results include: Reviewed previous cardiology evaluations including echo,  Physical Exam: Physical exam performed. The pertinent findings include: Cranial nerves II through XII grossly intact.  Intact finger-nose, intact heel-to-shin.  Romberg negative, gait normal.  Alert and oriented x3.  Moves all 4 limbs spontaneously, normal coordination.  No pronator drift.  Intact strength 5 out of 5 bilateral upper and lower extremities.    Vital signs stable throughout ED evaluation.  Lab Tests/Imaging studies: I personally interpreted labs/imaging and the pertinent results include: CBC unremarkable, CMP unremarkable other than some hyperglycemia 139, initial troponin 45, delta 38, is reassuring for no ACS but does seems to indicate some high risk syncope. I independently interpreted CT head wo with contrast which shows no evidence of acute intracranial abnormality. I agree with the radiologist interpretation.  Cardiac monitoring: EKG obtained and interpreted by myself and attending physician which shows: sinus tachycardia prolonged PR, nonspecific T wave abnormality with no acute ST changes.  Consults: Spoke with the hospitalist, Dr. Shereen Dike who agrees to admission for high risk syncope and patient with previous history of coronary artery disease, multiple stents and elevated troponin despite flat troponin on recheck.  Disposition: After consideration of the diagnostic results and the patients response to treatment, I feel  that patient would benefit from admission as discussed above .   Final Clinical Impression(s) / ED Diagnoses Final diagnoses:  None    Rx / DC Orders ED Discharge Orders     None         Nelly Banco, PA-C 12/22/23 1936    Afton Horse T, DO 12/23/23 2248

## 2023-12-22 NOTE — ED Notes (Signed)
 Patient transported to CT

## 2023-12-22 NOTE — ED Triage Notes (Addendum)
 Pt was at church, was walking to the back of the church and began feeling light headed and sob.  Someone helped him to the floor and placed him in trendelenburg. Pt states he forgot to take his meds this am.   No neuro deficits noted on assessment in triage.  Given 3 81 mg asa.  Hx of 4 stents placed.  Bp 150/70 Hr 80 nsr Cbg 155  18 L ac

## 2023-12-22 NOTE — H&P (Signed)
 History and Physical    Patient: Mark Melendez:[id number] DOB: 06-May-1961 DOA: 12/22/2023 DOS: the patient was seen and examined on 12/22/2023 PCP: [facility] [place]  Patient coming from: Home  Chief Complaint:  Chief Complaint  Patient presents with   Near Syncope   HPI: Mark Melendez is a 63 y.o. male with medical history significant of coronary artery disease, essential hypertension, diabetes, non-insulin-dependent, hyperlipidemia, GERD, who presents to the ER with syncopal episode.  Patient was doing fine this morning.  He went to church and was sitting to the service.  Later he was walking to the back of the chest and began feeling lightheaded and shortness of breath.  He almost fell to the ground was someone helped him to the floor and placed him in laying condition.  He did not take his medication this morning.  Did not hit his head.  He has previous stents placed.  On arrival in the ER his vitals were stable with blood pressure 150/70 and a heart rate of 80.  Blood sugar also stable.  Patient has no neurologic deficit.  He apparently has had previous syncopal episode many years ago but nothing recently.  No chest pain.  Denied also any respiratory symptoms.  Initial workup has been negative.  Patient being admitted to the hospital for syncopal workup.  He does have troponin mildly elevated with negative delta.  Review of Systems: As mentioned in the history of present illness. All other systems reviewed and are negative. Past Medical History:  Diagnosis Date   Coronary artery disease    Hypertension    Past Surgical History:  Procedure Laterality Date   CORONARY STENT PLACEMENT     4 stents   Social History:  reports that he has never smoked. He does not have any smokeless tobacco history on file. He reports current alcohol use. He reports that he does not use drugs.  No Known Allergies  History reviewed. No pertinent family history.  Prior to  Admission medications   Medication Sig Start Date End Date Taking? Authorizing Provider  aspirin  EC 81 MG EC tablet Take 1 tablet (81 mg total) by mouth daily. 03/15/15   Chapman Commodore, MD  aspirin  EC 81 MG tablet Take 324 mg by mouth every evening. Swallow whole.    [provider]  benzonatate  (TESSALON  PERLES) 100 MG capsule Take 1 capsule (100 mg total) by mouth 3 (three) times daily as needed. Patient not taking: Reported on 11/18/2019 05/25/19   Tommas Fragmin A, FNP  esomeprazole  (NEXIUM ) 40 MG capsule Take 1 capsule (40 mg total) by mouth daily at 12 noon. Patient not taking: Reported on 11/18/2019 03/14/15   Chapman Commodore, MD  ezetimibe (ZETIA) 10 MG tablet Take 10 mg by mouth daily. 10/02/19   [provider]  fish oil-omega-3 fatty acids 1000 MG capsule Take 1 g by mouth every evening.    [provider]  fluticasone (FLONASE ALLERGY RELIEF) 50 MCG/ACT nasal spray Place 2 sprays into both nostrils daily. 09/06/14   [provider]  JARDIANCE 25 MG TABS tablet Take 25 mg by mouth daily. 10/02/21   [provider]  lisinopril  (ZESTRIL ) 20 MG tablet Take 20 mg by mouth daily. 10/02/19   [provider]  meclizine  (ANTIVERT ) 25 MG tablet Take 1 tablet (25 mg total) by mouth 3 (three) times daily as needed for dizziness. 02/12/22   Sofia, Leslie K, PA-C  metFORMIN (GLUCOPHAGE-XR) 500 MG 24 hr tablet  Take 1,000 mg by mouth in the morning and at bedtime. 09/21/19   [provider]  metoprolol  succinate (TOPROL -XL) 25 MG 24 hr tablet Take 25 mg by mouth daily.    [provider]  Multiple Vitamins-Minerals (MULTIVITAMIN PO) Take 1 tablet by mouth daily.    [provider]  nitroGLYCERIN  (NITROSTAT ) 0.4 MG SL tablet Place 1 tablet (0.4 mg total) under the tongue every 5 (five) minutes x 3 doses as needed for chest pain. 03/14/15   Chapman Commodore, MD  rosuvastatin (CRESTOR) 20 MG tablet Take 20 mg by mouth daily. 09/24/19    [provider]  tamsulosin (FLOMAX) 0.4 MG CAPS capsule Take 0.4 mg by mouth daily. 12/10/21   [provider]    Physical Exam: Vitals:   12/22/23 1722 12/22/23 1730 12/22/23 1800 12/22/23 1830  BP:  131/88 115/80 121/80  Pulse:  80 78 76  Resp:  15 19 (!) 22  Temp: 98.4 F (36.9 C)     TempSrc: Oral     SpO2:  95% 97% 94%  Weight:      Height:       Constitutional: Acutely ill looking, NAD, calm, comfortable Eyes: PERRL, lids and conjunctivae normal ENMT: Mucous membranes are dry. Posterior pharynx clear of any exudate or lesions.Normal dentition.  Neck: normal, supple, no masses, no thyromegaly Respiratory: clear to auscultation bilaterally, no wheezing, no crackles. Normal respiratory effort. No accessory muscle use.  Cardiovascular: Regular rate and rhythm, no murmurs / rubs / gallops. No extremity edema. 2+ pedal pulses. No carotid bruits.  Abdomen: no tenderness, no masses palpated. No hepatosplenomegaly. Bowel sounds positive.  Musculoskeletal: Good range of motion, no joint swelling or tenderness, Skin: no rashes, lesions, ulcers. No induration Neurologic: CN 2-12 grossly intact. Sensation intact, DTR normal. Strength 5/5 in all 4.  Psychiatric: Normal judgment and insight. Alert and oriented x 3. Normal mood  Data Reviewed:  Glucose 139, CBC and electrolytes all within normal.  Blood pressure 150/90, pulse 85, EKG showed no acute findings.  Head CT without contrast showed no acute findings  Assessment and Plan:  #1 syncope and collapse: No obvious cause.  Patient has diabetes, hypertension, coronary artery disease.  Suspect arrhythmia versus dehydration.  Remotely CVA versus TIA.  Patient will be admitted for observation.  Will get echocardiogram.  PT OT.  Hydrate.  Consider possible MRI of the brain.  So far patient is stable at baseline.  #2 essential hypertension: Blood pressure is stable.  No hypotension and no orthostasis.  #3  non-insulin-dependent diabetes: On Jardiance and metformin.  We will hold and initiate sliding scale insulin.  #4 hyperlipidemia: Continue with statin.  #5 coronary artery disease: Troponin was 45 and then 38.  EKG stable.  Will observe.  Continue to trend enzymes.  Patient has history of previous statements.  Follow echocardiogram.  #6 GERD: Continue with PPIs    Advance Care Planning:   Code Status: Prior full code  Consults: None  Family Communication: Wife at bedside  Severity of Illness: The appropriate patient status for this patient is OBSERVATION. Observation status is judged to be reasonable and necessary in order to provide the required intensity of service to ensure the patient's safety. The patient's presenting symptoms, physical exam findings, and initial radiographic and laboratory data in the context of their medical condition is felt to place them at decreased risk for further clinical deterioration. Furthermore, it is anticipated that the patient will be medically stable for discharge from the  hospital within 2 midnights of admission.   AuthorCarolin Chyle, MD 12/22/2023 7:46 PM  For on call review [email or web address].

## 2023-12-22 NOTE — ED Provider Triage Note (Signed)
 Emergency Medicine Provider Triage Evaluation Note  Mark Melendez , a 63 y.o. male  was evaluated in triage.  Pt complains of syncope at church.  Patient reports that he was walking.  He began feeling hot and short of breath.  Patient reports that he was helped to the ground.  Patient states that he did not fall he did not strike his head.  Patient states he did not completely blackout.  Patient denies any history of syncope patient has not had any chest pain he denies headache.  Patient has a past medical history of coronary artery disease.  Patient has stents.  He has a history of hypertension.  Review of Systems  Positive: syncope Negative: fever  Physical Exam  BP 117/85   Pulse 99   Temp 98.9 F (37.2 C) (Oral)   Resp 18   Ht 5\' 10"  (1.778 m)   Wt 81.2 kg   SpO2 100%   BMI 25.69 kg/m  Gen:   Awake, no distress   Resp:  Normal effort  MSK:   Moves extremities without difficulty  Other:    Medical Decision Making  Medically screening exam initiated at 1:37 PM.  Appropriate orders placed.  Shaub Duck was informed that the remainder of the evaluation will be completed by another provider, this initial triage assessment does not replace that evaluation, and the importance of remaining in the ED until their evaluation is complete.     Sandi Crosby, PA-C 12/22/23 1340

## 2023-12-23 ENCOUNTER — Observation Stay (HOSPITAL_COMMUNITY)

## 2023-12-23 DIAGNOSIS — R55 Syncope and collapse: Secondary | ICD-10-CM

## 2023-12-23 LAB — COMPREHENSIVE METABOLIC PANEL WITH GFR
ALT: 20 U/L (ref 0–44)
AST: 23 U/L (ref 15–41)
Albumin: 3.7 g/dL (ref 3.5–5.0)
Alkaline Phosphatase: 46 U/L (ref 38–126)
Anion gap: 7 (ref 5–15)
BUN: 13 mg/dL (ref 8–23)
CO2: 22 mmol/L (ref 22–32)
Calcium: 9.4 mg/dL (ref 8.9–10.3)
Chloride: 109 mmol/L (ref 98–111)
Creatinine, Ser: 0.93 mg/dL (ref 0.61–1.24)
GFR, Estimated: >60 mL/min (ref >60)
Glucose, Bld: 152 mg/dL — ABNORMAL HIGH (ref 70–99)
Potassium: 4.1 mmol/L (ref 3.5–5.1)
Sodium: 138 mmol/L (ref 135–145)
Total Bilirubin: 0.8 mg/dL (ref 0.0–1.2)
Total Protein: 6.7 g/dL (ref 6.5–8.1)

## 2023-12-23 LAB — ECHOCARDIOGRAM COMPLETE
Area-P 1/2: 3.99 cm2
Height: 70 in
S' Lateral: 2.6 cm
Weight: 2864.22 [oz_av]

## 2023-12-23 LAB — CBC
HCT: 48.4 % (ref 39.0–52.0)
Hemoglobin: 16.2 g/dL (ref 13.0–17.0)
MCH: 29.2 pg (ref 26.0–34.0)
MCHC: 33.5 g/dL (ref 30.0–36.0)
MCV: 87.4 fL (ref 80.0–100.0)
Platelets: 241 10*3/uL (ref 150–400)
RBC: 5.54 MIL/uL (ref 4.22–5.81)
RDW: 13.2 % (ref 11.5–15.5)
WBC: 6.3 10*3/uL (ref 4.0–10.5)
nRBC: 0 % (ref 0.0–0.2)

## 2023-12-23 NOTE — Progress Notes (Signed)
  Echocardiogram 2D Echocardiogram has been performed.  Farley Honer, RDCS 12/23/2023, 12:27 PM

## 2023-12-23 NOTE — Discharge Summary (Signed)
 Mark Melendez ZOX:[id number] DOB: [birth year] DOA: 12/22/2023  PCP: [facility] [place]  Admit date: 12/22/2023 Discharge date: 12/23/2023  Time spent: 35 minutes  Recommendations for Outpatient Follow-up:  Pcp f/u Cardiology f/u 1 week     Discharge Diagnoses:  Principal Problem:   Syncope and collapse Active Problems:   CAD (coronary artery disease)   Controlled type 2 diabetes mellitus without complication, without long-term current use of insulin  (HCC)   Essential hypertension   Hyperlipidemia   GERD (gastroesophageal reflux disease)   Discharge Condition: stable  Diet recommendation: heart healthy  Filed Weights   12/22/23 1250  Weight: 81.2 kg    History of present illness:  From admission h and p Mark Melendez is a 62 y.o. male with medical history significant of coronary artery disease, essential hypertension, diabetes, non-insulin -dependent, hyperlipidemia, GERD, who presents to the ER with syncopal episode.  Patient was doing fine this morning.  He went to church and was sitting to the service.  Later he was walking to the back of the chest and began feeling lightheaded and shortness of breath.  He almost fell to the ground was someone helped him to the floor and placed him in laying condition.  He did not take his medication this morning.  Did not hit his head.  He has previous stents placed.  On arrival in the ER his vitals were stable with blood pressure 150/70 and a heart rate of 80.  Blood sugar also stable.  Patient has no neurologic deficit.  He apparently has had previous syncopal episode many years ago but nothing recently.  No chest pain.  Denied also any respiratory symptoms.  Initial workup has been negative.  Patient being admitted to the hospital for syncopal workup.  He does have troponin mildly elevated with negative delta.   Hospital Course:  Patient presents with a solitary episode of syncope that appears orthostatic in etiology.  Etiology, however, unclear. Not volume down, no recent illness. On several home meds that can contribute, but no recent med changes. No chest pain or ischemic changes on EKG, mild trop elevation that down trended (45>38), doubt acs. EKG shows first degree AV block, no significant abnormality. Echocardiogram unremarkable. No dyspnea or RV dysfunction on TTE, think PE unlikely. Orthostats negative, patient ambulated well without recurrence of problem. Discussed case with Dr. Glena Landau (patient's cardiologist), plan will be close outpatient f/u with Dr. Glena Landau, consider event monitor. May need to consider adjusting home meds if symptoms recur.   Procedures: none   Consultations: none  Discharge Exam: Vitals:   12/23/23 1300 12/23/23 1440  BP:  137/89  Pulse: 93 84  Resp: 20 18  Temp:  98 F (36.7 C)  SpO2: 100% 91%    General: NAD Cardiovascular: RRR Respiratory: CTAB  Discharge Instructions   Discharge Instructions     Diet - low sodium heart healthy   Complete by: As directed    Increase activity slowly   Complete by: As directed       Allergies as of 12/23/2023   No Known Allergies      Medication List     TAKE these medications    ezetimibe  10 MG tablet Commonly known as: ZETIA  Take 10 mg by mouth daily.   fish oil-omega-3 fatty acids 1000 MG capsule Take 1 g by mouth at bedtime.   Flonase Allergy Relief 50 MCG/ACT nasal spray Generic drug: fluticasone Place 1 spray into both nostrils daily.   Jardiance 25  MG Tabs tablet Generic drug: empagliflozin Take 25 mg by mouth daily.   lisinopril  20 MG tablet Commonly known as: ZESTRIL  Take 20 mg by mouth daily.   metFORMIN 500 MG 24 hr tablet Commonly known as: GLUCOPHAGE-XR Take 1,000 mg by mouth in the morning and at bedtime.   metoprolol  succinate 25 MG 24 hr tablet Commonly known as: TOPROL -XL Take 25 mg by mouth daily.   MULTIVITAMIN PO Take 1 tablet by mouth daily.   nitroGLYCERIN  0.4 MG SL  tablet Commonly known as: NITROSTAT  Place 1 tablet (0.4 mg total) under the tongue every 5 (five) minutes x 3 doses as needed for chest pain.   rosuvastatin  20 MG tablet Commonly known as: CRESTOR  Take 20 mg by mouth daily.   tamsulosin  0.4 MG Caps capsule Commonly known as: FLOMAX  Take 0.4 mg by mouth daily.       No Known Allergies  Follow-up Information     [facility] Follow up.   Specialty: Family Medicine Contact information: [address] [place] [address] [phone]         Chapman Commodore, MD Follow up.   Specialty: Cardiology Contact information: [address] [place] [address] [phone]                  The results of significant diagnostics from this hospitalization (including imaging, microbiology, ancillary and laboratory) are listed below for reference.    Significant Diagnostic Studies: ECHOCARDIOGRAM COMPLETE Result Date: 12/23/2023    ECHOCARDIOGRAM REPORT   Patient Name:   Mark Melendez Date of Exam: 12/23/2023 Medical Rec #:  [id number]      Height:       70.0 in Accession #:    [id number]     Weight:       179.0 lb Date of Birth:  [birth year]       BSA:          1.991 m Patient Age:    63 years       BP:           129/100 mmHg Patient Gender: M              HR:           68 bpm. Exam Location:  Inpatient Procedure: 2D Echo, Color Doppler and Cardiac Doppler (Both Spectral and Color            Flow Doppler were utilized during procedure). Indications:    Syncope R55  History:        Patient has no prior history of Echocardiogram examinations.                 CAD, Signs/Symptoms:Syncope; Risk Factors:Hypertension,                 Dyslipidemia and Diabetes.  Sonographer:    Kip Peon RDCS Referring Phys: [id number] MOHAMMAD L GARBA IMPRESSIONS  1. Left ventricular ejection fraction, by estimation, is 60 to 65%. The left ventricle has normal function. The left ventricle has no regional wall  motion abnormalities. There is moderate left ventricular hypertrophy. Left ventricular diastolic parameters are indeterminate.  2. Right ventricular systolic function is normal. The right ventricular size is normal.  3. The mitral valve is normal in structure. Trivial mitral valve regurgitation. No evidence of mitral stenosis.  4. The aortic valve is tricuspid. Aortic valve regurgitation is not visualized. Aortic valve sclerosis is present, with no evidence of aortic  valve stenosis.  5. Aortic dilatation noted. There is mild dilatation of the aortic root, measuring 39 mm.  6. The inferior vena cava is normal in size with greater than 50% respiratory variability, suggesting right atrial pressure of 3 mmHg. Comparison(s): No prior Echocardiogram. FINDINGS  Left Ventricle: Left ventricular ejection fraction, by estimation, is 60 to 65%. The left ventricle has normal function. The left ventricle has no regional wall motion abnormalities. The left ventricular internal cavity size was normal in size. There is  moderate left ventricular hypertrophy. Left ventricular diastolic parameters are indeterminate. Right Ventricle: The right ventricular size is normal. Right ventricular systolic function is normal. Left Atrium: Left atrial size was normal in size. Right Atrium: Right atrial size was normal in size. Pericardium: There is no evidence of pericardial effusion. Mitral Valve: The mitral valve is normal in structure. Mild mitral annular calcification. Trivial mitral valve regurgitation. No evidence of mitral valve stenosis. Tricuspid Valve: The tricuspid valve is normal in structure. Tricuspid valve regurgitation is not demonstrated. No evidence of tricuspid stenosis. Aortic Valve: The aortic valve is tricuspid. Aortic valve regurgitation is not visualized. Aortic valve sclerosis is present, with no evidence of aortic valve stenosis. Pulmonic Valve: The pulmonic valve was normal in structure. Pulmonic valve regurgitation  is trivial. No evidence of pulmonic stenosis. Aorta: Aortic dilatation noted. There is mild dilatation of the aortic root, measuring 39 mm. Venous: The inferior vena cava is normal in size with greater than 50% respiratory variability, suggesting right atrial pressure of 3 mmHg. IAS/Shunts: No atrial level shunt detected by color flow Doppler.  LEFT VENTRICLE PLAX 2D LVIDd:         4.40 cm   Diastology LVIDs:         2.60 cm   LV e' medial:    5.25 cm/s LV PW:         1.10 cm   LV E/e' medial:  13.5 LV IVS:        1.10 cm   LV e' lateral:   6.68 cm/s LVOT diam:     2.10 cm   LV E/e' lateral: 10.6 LV SV:         61 LV SV Index:   31 LVOT Area:     3.46 cm  RIGHT VENTRICLE             IVC RV Basal diam:  3.00 cm     IVC diam: 1.60 cm RV S prime:     11.40 cm/s TAPSE (M-mode): 2.5 cm LEFT ATRIUM           Index        RIGHT ATRIUM           Index LA diam:      3.50 cm 1.76 cm/m   RA Area:     14.10 cm LA Vol (A4C): 30.0 ml 15.07 ml/m  RA Volume:   29.50 ml  14.81 ml/m  AORTIC VALVE LVOT Vmax:   87.00 cm/s LVOT Vmean:  56.600 cm/s LVOT VTI:    0.177 m  AORTA Ao Root diam: 3.90 cm Ao Asc diam:  3.30 cm MITRAL VALVE MV Area (PHT): 3.99 cm    SHUNTS MV Decel Time: 190 msec    Systemic VTI:  0.18 m MV E velocity: 71.00 cm/s  Systemic Diam: 2.10 cm MV A velocity: 44.70 cm/s MV E/A ratio:  1.59 Alexandria Angel MD Electronically signed by Alexandria Angel MD Signature Date/Time: 12/23/2023/2:06:55 PM    Final  CT Head Wo Contrast Result Date: 12/22/2023 CLINICAL DATA:  Syncope/presyncope, cerebrovascular cause suspected EXAM: CT HEAD WITHOUT CONTRAST TECHNIQUE: Contiguous axial images were obtained from the base of the skull through the vertex without intravenous contrast. RADIATION DOSE REDUCTION: This exam was performed according to the departmental dose-optimization program which includes automated exposure control, adjustment of the mA and/or kV according to patient size and/or use of iterative reconstruction  technique. COMPARISON:  Head CT 02/12/2022 FINDINGS: Brain: No intracranial hemorrhage, mass effect, or midline shift. No hydrocephalus. The basilar cisterns are patent. No evidence of territorial infarct or acute ischemia. No extra-axial or intracranial fluid collection. Vascular: No hyperdense vessel or unexpected calcification. Skull: No fracture or focal lesion. Sinuses/Orbits: Paranasal sinuses and mastoid air cells are clear. The visualized orbits are unremarkable. Other: None. IMPRESSION: No acute intracranial abnormality. Electronically Signed   By: Chadwick Colonel M.D.   On: 12/22/2023 16:28    Microbiology: No results found for this or any previous visit (from the past 240 hours).   Labs: Basic Metabolic Panel: Recent Labs  Lab 12/22/23 1306 12/22/23 2105 12/23/23 0647  NA 140  --  138  K 4.4  --  4.1  CL 105  --  109  CO2 24  --  22  GLUCOSE 139*  --  152*  BUN 16  --  13  CREATININE 0.99 1.00 0.93  CALCIUM  9.7  --  9.4   Liver Function Tests: Recent Labs  Lab 12/22/23 1306 12/23/23 0647  AST 29 23  ALT 21 20  ALKPHOS 48 46  BILITOT 0.6 0.8  PROT 6.6 6.7  ALBUMIN 3.9 3.7   No results for input(s): "LIPASE", "AMYLASE" in the last 168 hours. No results for input(s): "AMMONIA" in the last 168 hours. CBC: Recent Labs  Lab 12/22/23 1306 12/22/23 2105 12/23/23 0647  WBC 7.8 6.6 6.3  HGB 16.5 15.4 16.2  HCT 48.7 46.4 48.4  MCV 87.7 88.2 87.4  PLT 261 243 241   Cardiac Enzymes: No results for input(s): "CKTOTAL", "CKMB", "CKMBINDEX", "TROPONINI" in the last 168 hours. BNP: BNP (last 3 results) No results for input(s): "BNP" in the last 8760 hours.  ProBNP (last 3 results) No results for input(s): "PROBNP" in the last 8760 hours.  CBG: Recent Labs  Lab 12/22/23 1305 12/22/23 2226  GLUCAP 134* 186*       Signed:  Raymonde Calico MD.  [facility] 12/23/2023, 4:16 PM

## 2023-12-23 NOTE — ED Notes (Signed)
 Pt understood d/c instructions and when to return to the ED. IV d/c and VS retaken. Pt wife picked up pt from ED

## 2023-12-24 DIAGNOSIS — I10 Essential (primary) hypertension: Secondary | ICD-10-CM | POA: Diagnosis not present

## 2023-12-24 DIAGNOSIS — R0609 Other forms of dyspnea: Secondary | ICD-10-CM | POA: Diagnosis not present

## 2023-12-24 DIAGNOSIS — I2510 Atherosclerotic heart disease of native coronary artery without angina pectoris: Secondary | ICD-10-CM | POA: Diagnosis not present

## 2023-12-24 DIAGNOSIS — R55 Syncope and collapse: Secondary | ICD-10-CM | POA: Diagnosis not present

## 2023-12-25 ENCOUNTER — Encounter (HOSPITAL_COMMUNITY): Payer: Self-pay | Admitting: Cardiology

## 2023-12-25 ENCOUNTER — Other Ambulatory Visit (HOSPITAL_COMMUNITY): Payer: Self-pay | Admitting: Cardiology

## 2023-12-25 DIAGNOSIS — R079 Chest pain, unspecified: Secondary | ICD-10-CM

## 2023-12-27 DIAGNOSIS — R002 Palpitations: Secondary | ICD-10-CM | POA: Diagnosis not present

## 2024-01-01 ENCOUNTER — Ambulatory Visit (HOSPITAL_COMMUNITY)
Admission: RE | Admit: 2024-01-01 | Discharge: 2024-01-01 | Disposition: A | Discharge status: Home / Self Care | Source: Ambulatory Visit | Attending: Cardiology | Admitting: Cardiology

## 2024-01-01 DIAGNOSIS — R079 Chest pain, unspecified: Secondary | ICD-10-CM | POA: Insufficient documentation

## 2024-01-01 DIAGNOSIS — R072 Precordial pain: Secondary | ICD-10-CM | POA: Diagnosis not present

## 2024-01-01 MED ORDER — TECHNETIUM TC 99M TETROFOSMIN IV KIT
10.8000 | PACK | Freq: Once | INTRAVENOUS | Status: AC | PRN
  Administered 2024-01-01: 10.8 via INTRAVENOUS

## 2024-01-01 MED ORDER — TECHNETIUM TC 99M TETROFOSMIN IV KIT
32.0000 | PACK | Freq: Once | INTRAVENOUS | Status: AC | PRN
  Administered 2024-01-01: 32 via INTRAVENOUS

## 2024-02-23 DIAGNOSIS — M25522 Pain in left elbow: Secondary | ICD-10-CM | POA: Diagnosis not present

## 2024-02-23 DIAGNOSIS — M7022 Olecranon bursitis, left elbow: Secondary | ICD-10-CM | POA: Diagnosis not present

## 2024-03-18 DIAGNOSIS — R0981 Nasal congestion: Secondary | ICD-10-CM | POA: Diagnosis not present

## 2024-03-18 DIAGNOSIS — E785 Hyperlipidemia, unspecified: Secondary | ICD-10-CM | POA: Diagnosis not present

## 2024-03-18 DIAGNOSIS — I10 Essential (primary) hypertension: Secondary | ICD-10-CM | POA: Diagnosis not present

## 2024-03-18 DIAGNOSIS — E1169 Type 2 diabetes mellitus with other specified complication: Secondary | ICD-10-CM | POA: Diagnosis not present

## 2024-03-18 DIAGNOSIS — I4891 Unspecified atrial fibrillation: Secondary | ICD-10-CM | POA: Diagnosis not present

## 2024-04-13 DIAGNOSIS — Z6830 Body mass index (BMI) 30.0-30.9, adult: Secondary | ICD-10-CM | POA: Diagnosis not present

## 2024-04-13 DIAGNOSIS — M7022 Olecranon bursitis, left elbow: Secondary | ICD-10-CM | POA: Diagnosis not present

## 2024-04-13 DIAGNOSIS — G629 Polyneuropathy, unspecified: Secondary | ICD-10-CM | POA: Diagnosis not present

## 2024-04-24 DIAGNOSIS — M25422 Effusion, left elbow: Secondary | ICD-10-CM | POA: Diagnosis not present

## 2024-05-01 DIAGNOSIS — J069 Acute upper respiratory infection, unspecified: Secondary | ICD-10-CM | POA: Diagnosis not present

## 2025-01-19 ENCOUNTER — Ambulatory Visit: Admitting: Neurology
